# Patient Record
Sex: Female | Born: 1965 | Hispanic: Yes | Smoking: Never smoker
Health system: Southern US, Community
[De-identification: ages and names within clinical notes are randomized; demographics above are authoritative.]

## PROBLEM LIST (undated history)

## (undated) DIAGNOSIS — E785 Hyperlipidemia, unspecified: Secondary | ICD-10-CM

## (undated) HISTORY — DX: Hyperlipidemia, unspecified: E78.5

## (undated) HISTORY — PX: TUBAL LIGATION: SHX77

---

## 2010-11-07 ENCOUNTER — Other Ambulatory Visit (HOSPITAL_COMMUNITY): Payer: Self-pay | Admitting: Family Medicine

## 2010-11-07 DIAGNOSIS — Z139 Encounter for screening, unspecified: Secondary | ICD-10-CM

## 2010-11-12 ENCOUNTER — Encounter (HOSPITAL_COMMUNITY): Payer: Self-pay

## 2010-11-13 ENCOUNTER — Inpatient Hospital Stay (HOSPITAL_COMMUNITY): Admission: RE | Admit: 2010-11-13 | Payer: Self-pay | Source: Ambulatory Visit

## 2013-12-23 ENCOUNTER — Other Ambulatory Visit (HOSPITAL_COMMUNITY): Payer: Self-pay | Admitting: Physician Assistant

## 2013-12-23 DIAGNOSIS — Z1231 Encounter for screening mammogram for malignant neoplasm of breast: Secondary | ICD-10-CM

## 2014-01-02 ENCOUNTER — Encounter (HOSPITAL_COMMUNITY): Payer: Self-pay

## 2014-01-03 ENCOUNTER — Ambulatory Visit (HOSPITAL_COMMUNITY): Payer: Self-pay

## 2014-01-09 ENCOUNTER — Ambulatory Visit (HOSPITAL_COMMUNITY): Payer: Self-pay

## 2014-01-16 ENCOUNTER — Ambulatory Visit (HOSPITAL_COMMUNITY)
Admission: RE | Admit: 2014-01-16 | Discharge: 2014-01-16 | Disposition: A | Discharge status: Home / Self Care | Payer: Self-pay | Source: Ambulatory Visit | Attending: Physician Assistant | Admitting: Physician Assistant

## 2014-01-16 DIAGNOSIS — Z1231 Encounter for screening mammogram for malignant neoplasm of breast: Secondary | ICD-10-CM

## 2014-01-20 ENCOUNTER — Other Ambulatory Visit: Payer: Self-pay | Admitting: Physician Assistant

## 2014-01-20 DIAGNOSIS — R928 Other abnormal and inconclusive findings on diagnostic imaging of breast: Secondary | ICD-10-CM

## 2014-02-21 ENCOUNTER — Encounter (HOSPITAL_COMMUNITY): Payer: Self-pay

## 2014-03-07 ENCOUNTER — Ambulatory Visit (HOSPITAL_COMMUNITY)
Admission: RE | Admit: 2014-03-07 | Discharge: 2014-03-07 | Disposition: A | Discharge status: Home / Self Care | Payer: PRIVATE HEALTH INSURANCE | Source: Ambulatory Visit | Attending: Physician Assistant | Admitting: Physician Assistant

## 2014-03-07 DIAGNOSIS — R928 Other abnormal and inconclusive findings on diagnostic imaging of breast: Secondary | ICD-10-CM | POA: Insufficient documentation

## 2014-09-11 ENCOUNTER — Other Ambulatory Visit (HOSPITAL_COMMUNITY): Payer: Self-pay | Admitting: *Deleted

## 2014-09-11 DIAGNOSIS — Z09 Encounter for follow-up examination after completed treatment for conditions other than malignant neoplasm: Secondary | ICD-10-CM

## 2014-09-13 ENCOUNTER — Encounter (HOSPITAL_COMMUNITY): Payer: Self-pay

## 2014-10-24 ENCOUNTER — Ambulatory Visit (HOSPITAL_COMMUNITY)
Admission: RE | Admit: 2014-10-24 | Discharge: 2014-10-24 | Disposition: A | Discharge status: Home / Self Care | Payer: PRIVATE HEALTH INSURANCE | Source: Ambulatory Visit | Attending: *Deleted | Admitting: *Deleted

## 2014-10-24 DIAGNOSIS — Z09 Encounter for follow-up examination after completed treatment for conditions other than malignant neoplasm: Secondary | ICD-10-CM | POA: Insufficient documentation

## 2014-10-24 DIAGNOSIS — R921 Mammographic calcification found on diagnostic imaging of breast: Secondary | ICD-10-CM | POA: Insufficient documentation

## 2014-10-25 ENCOUNTER — Emergency Department (HOSPITAL_COMMUNITY)
Admission: EM | Admit: 2014-10-25 | Discharge: 2014-10-25 | Disposition: A | Discharge status: Home / Self Care | Payer: Self-pay | Attending: Emergency Medicine | Admitting: Emergency Medicine

## 2014-10-25 ENCOUNTER — Encounter (HOSPITAL_COMMUNITY): Payer: Self-pay | Admitting: Emergency Medicine

## 2014-10-25 ENCOUNTER — Emergency Department (HOSPITAL_COMMUNITY): Payer: Self-pay

## 2014-10-25 DIAGNOSIS — M5441 Lumbago with sciatica, right side: Secondary | ICD-10-CM | POA: Insufficient documentation

## 2014-10-25 DIAGNOSIS — Z3202 Encounter for pregnancy test, result negative: Secondary | ICD-10-CM | POA: Insufficient documentation

## 2014-10-25 DIAGNOSIS — M5136 Other intervertebral disc degeneration, lumbar region: Secondary | ICD-10-CM | POA: Insufficient documentation

## 2014-10-25 LAB — URINALYSIS, ROUTINE W REFLEX MICROSCOPIC
BILIRUBIN URINE: NEGATIVE
Glucose, UA: NEGATIVE mg/dL
HGB URINE DIPSTICK: NEGATIVE
Ketones, ur: NEGATIVE mg/dL
Leukocytes, UA: NEGATIVE
NITRITE: NEGATIVE
PH: 6 (ref 5.0–8.0)
Protein, ur: NEGATIVE mg/dL
Specific Gravity, Urine: <1.005 — ABNORMAL LOW (ref 1.005–1.030)
Urobilinogen, UA: 0.2 mg/dL (ref 0.0–1.0)

## 2014-10-25 LAB — POC URINE PREG, ED: Preg Test, Ur: NEGATIVE

## 2014-10-25 MED ORDER — NAPROXEN 500 MG PO TABS
500.0000 mg | ORAL_TABLET | Freq: Two times a day (BID) | ORAL | Status: DC
Start: 2014-10-25 — End: 2014-11-16

## 2014-10-25 MED ORDER — CYCLOBENZAPRINE HCL 10 MG PO TABS
10.0000 mg | ORAL_TABLET | Freq: Once | ORAL | Status: AC
  Administered 2014-10-25: 10 mg via ORAL
  Filled 2014-10-25: qty 1

## 2014-10-25 MED ORDER — HYDROCODONE-ACETAMINOPHEN 5-325 MG PO TABS: 1.0000 | ORAL_TABLET | ORAL | Status: DC | PRN

## 2014-10-25 MED ORDER — CYCLOBENZAPRINE HCL 10 MG PO TABS: 10.0000 mg | ORAL_TABLET | Freq: Two times a day (BID) | ORAL | Status: DC | PRN

## 2014-10-25 MED ORDER — KETOROLAC TROMETHAMINE 60 MG/2ML IM SOLN
30.0000 mg | Freq: Once | INTRAMUSCULAR | Status: AC
  Administered 2014-10-25: 30 mg via INTRAMUSCULAR
  Filled 2014-10-25: qty 2

## 2014-10-25 NOTE — ED Notes (Signed)
Patient reports she has not had a menstrual cycle in 8 years, but states she has not had a hysterectomy.  She has had a tubal ligation.

## 2014-10-25 NOTE — Discharge Instructions (Signed)
Your CT scan tonight shows that you have a disc that is causing the problem in your lower back. You will need to follow up with Dr. Romeo AppleHarrison for further evaluation and treatment of the problem. Do not take the narcotic or the muscle relaxant if driving because they will make you sleepy.

## 2014-10-25 NOTE — ED Notes (Signed)
Pt states that she is having pain in right hip and lower back that started yesterday with no injury.

## 2014-10-25 NOTE — ED Provider Notes (Signed)
CSN: [id number]     Arrival date & time 10/25/14  1714 History   First MD Initiated Contact with Patient 10/25/14 1843     Chief Complaint  Patient presents with  . Back Pain     (Consider location/radiation/quality/duration/timing/severity/associated sxs/prior Treatment) Patient is a 49 y.o. female presenting with back pain. The history is provided by the patient.  Back Pain Location:  Lumbar spine Quality:  Aching Radiates to: right leg. Pain severity:  Severe Pain is:  Same all the time Onset quality:  Gradual Duration:  1 day Timing:  Constant Progression:  Worsening Chronicity:  New Relieved by:  Nothing Worsened by:  Ambulation, movement and standing Ineffective treatments:  None tried Associated symptoms: no bladder incontinence and no bowel incontinence    Heidi Gibson is a 49 y.o. female who presents to the ED with low back pain that radiates to the right hip. The pain started yesterday. She had a similar pain about a year ago and was told it was muscle pain. Occasionally the pain with radiate around to the abdomen.    History reviewed. No pertinent past medical history. Past Surgical History  Procedure Laterality Date  . Cesarean section     History reviewed. No pertinent family history. History  Substance Use Topics  . Smoking status: Never Smoker   . Smokeless tobacco: Not on file  . Alcohol Use: No   OB History    No data available     Review of Systems  Gastrointestinal: Negative for bowel incontinence.  Genitourinary: Negative for bladder incontinence.  Musculoskeletal: Positive for back pain.       Right leg pain  all other systems negataive    Allergies  Review of patient's allergies indicates no known allergies.  Home Medications   Prior to Admission medications   Medication Sig Start Date End Date Taking? Authorizing Provider  cyclobenzaprine (FLEXERIL) 10 MG tablet Take 1 tablet (10 mg total) by mouth 2 (two) times daily as needed  for muscle spasms. 10/25/14   Hope Orlene OchM Neese, NP  HYDROcodone-acetaminophen (NORCO/VICODIN) 5-325 MG per tablet Take 1 tablet by mouth every 4 (four) hours as needed. 10/25/14   Hope Orlene OchM Neese, NP  naproxen (NAPROSYN) 500 MG tablet Take 1 tablet (500 mg total) by mouth 2 (two) times daily. 10/25/14   Hope Orlene OchM Neese, NP   BP 112/60 mmHg  Pulse 88  Temp(Src) 98.1 F (36.7 C) (Oral)  Resp 20  Ht 5\' 6"  (1.676 m)  Wt 237 lb (107.502 kg)  BMI 38.27 kg/m2  SpO2 100%  LMP  Physical Exam  Constitutional: She is oriented to person, place, and time. She appears well-developed and well-nourished. No distress.  HENT:  Head: Normocephalic and atraumatic.  Right Ear: Tympanic membrane normal.  Left Ear: Tympanic membrane normal.  Nose: Nose normal.  Mouth/Throat: Uvula is midline, oropharynx is clear and moist and mucous membranes are normal.  Eyes: Conjunctivae and EOM are normal.  Neck: Normal range of motion. Neck supple.  Cardiovascular: Normal rate and regular rhythm.   Pulmonary/Chest: Effort normal. She has no wheezes. She has no rales.  Abdominal: Soft. Bowel sounds are normal. There is no tenderness.  Musculoskeletal: Normal range of motion.       Lumbar back: She exhibits tenderness, pain and spasm. She exhibits normal pulse.       Back:       Legs: Pedal pulses 2+ bilateral. Straight leg raises without difficulty. Pain with raising right. Adequate circulation. Pain radiates  from lumbar area to right buttock, thigh and calf. Ambulatory without foot drag.   Neurological: She is alert and oriented to person, place, and time. She has normal strength. No cranial nerve deficit or sensory deficit. Gait normal.  Reflex Scores:      Bicep reflexes are 2+ on the right side and 2+ on the left side.      Brachioradialis reflexes are 2+ on the right side and 2+ on the left side.      Patellar reflexes are 2+ on the right side and 2+ on the left side.      Achilles reflexes are 2+ on the right side and 2+  on the left side. Skin: Skin is warm and dry.  Psychiatric: She has a normal mood and affect. Her behavior is normal.  Nursing note and vitals reviewed.   ED Course  Procedures (including critical care time) Flexeril, Toradol, CT lumbar spine  Results for orders placed or performed during the hospital encounter of 10/25/14 (from the past 24 hour(s))  Urinalysis, Routine w reflex microscopic     Status: Abnormal   Collection Time: 10/25/14  8:03 PM  Result Value Ref Range   Color, Urine YELLOW YELLOW   APPearance CLEAR CLEAR   Specific Gravity, Urine <1.005 (L) 1.005 - 1.030   pH 6.0 5.0 - 8.0   Glucose, UA NEGATIVE NEGATIVE mg/dL   Hgb urine dipstick NEGATIVE NEGATIVE   Bilirubin Urine NEGATIVE NEGATIVE   Ketones, ur NEGATIVE NEGATIVE mg/dL   Protein, ur NEGATIVE NEGATIVE mg/dL   Urobilinogen, UA 0.2 0.0 - 1.0 mg/dL   Nitrite NEGATIVE NEGATIVE   Leukocytes, UA NEGATIVE NEGATIVE  POC Urine Pregnancy, ED (do NOT [facility] at [facility])     Status: None   Collection Time: 10/25/14  8:16 PM  Result Value Ref Range   Preg Test, Ur NEGATIVE NEGATIVE     Ct Lumbar Spine Wo Contrast  10/25/2014   CLINICAL DATA:  Right hip and lower back pain since yesterday. No known trauma.  EXAM: CT LUMBAR SPINE WITHOUT CONTRAST  TECHNIQUE: Multidetector CT imaging of the lumbar spine was performed without intravenous contrast administration. Multiplanar CT image reconstructions were also generated.  COMPARISON:  None.  FINDINGS: Vertebral column:  The lumbar vertebrae are normal in height. There is no spondylolisthesis. There is no bone lesion or bony destruction.  Posterior elements: The pedicles and posterior elements are intact. There is mild bilateral facet arthritis at L3-4, greater on the right. There is minimal facet arthritis at L4-5 on the left and mild facet arthritis at L5-S1 on the left.  Interspaces:  T12-L1:  Negative for significant abnormality.  L1-2:  Negative for significant abnormality.  L2-3:   Negative for significant abnormality.  L3-4:  Mild bulge without central canal or foraminal stenosis.  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  L5-S1: Mild bulge with patent central canal and mildly narrowed bilateral foramina.  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 Mild non stenotic degenerative disc and facet changes elsewhere. Lumbar MRI without contrast would be optimal for characterization.   Electronically Signed   By: Ellery Plunkaniel R Mitchell M.D.   On: 10/25/2014 23:18     MDM  49 y.o. female with low back pain that radiates to the right leg. Stable for d/c without focal neuro deficits and without neurovascular compromise. She will follow up with ortho. Will treat  for pain and muscle spasm. I have reviewed this patient's vital signs, nurses notes, appropriate labs and imaging. i have discussed findings and plan of care with the patient and her family. They voice understanding and agree with plan.   Final diagnoses:  Lumbar disc narrowing  Midline low back pain with right-sided sciatica      Janne Napoleon, NP 10/26/14 0001  Vanetta Mulders, MD 10/26/14 [phone]

## 2014-10-30 ENCOUNTER — Telehealth: Payer: Self-pay | Admitting: Orthopedic Surgery

## 2014-10-30 NOTE — Telephone Encounter (Signed)
No Self Pay

## 2014-10-30 NOTE — Telephone Encounter (Signed)
Patient was treated in the ER on Wednesday 10/25/14 for back pain, They have referred her to Follow up here, Dr. Romeo AppleHarrison could you please review her chart and see if this is something you can treat here at the office, please advise?

## 2014-10-30 NOTE — Telephone Encounter (Signed)
Patient has been scheduled for 11/14/14 and [facility] patients interpreter is aware

## 2014-10-30 NOTE — Telephone Encounter (Signed)
Ok   i ll see her in 2 weeks

## 2014-10-30 NOTE — Telephone Encounter (Signed)
Is she insured?  Need to know in case need to refer

## 2014-11-14 ENCOUNTER — Ambulatory Visit: Payer: Self-pay | Admitting: Orthopedic Surgery

## 2014-11-16 ENCOUNTER — Encounter: Payer: Self-pay | Admitting: Orthopedic Surgery

## 2014-11-16 ENCOUNTER — Telehealth: Payer: Self-pay | Admitting: *Deleted

## 2014-11-16 ENCOUNTER — Other Ambulatory Visit: Payer: Self-pay | Admitting: *Deleted

## 2014-11-16 ENCOUNTER — Ambulatory Visit (INDEPENDENT_AMBULATORY_CARE_PROVIDER_SITE_OTHER): Payer: Self-pay | Admitting: Orthopedic Surgery

## 2014-11-16 VITALS — BP 112/79 | Ht 66.0 in | Wt 237.0 lb

## 2014-11-16 DIAGNOSIS — M48061 Spinal stenosis, lumbar region without neurogenic claudication: Secondary | ICD-10-CM

## 2014-11-16 DIAGNOSIS — M5126 Other intervertebral disc displacement, lumbar region: Secondary | ICD-10-CM

## 2014-11-16 DIAGNOSIS — M4806 Spinal stenosis, lumbar region: Secondary | ICD-10-CM

## 2014-11-16 MED ORDER — PREDNISONE 10 MG (48) PO TBPK: ORAL_TABLET | Freq: Every day | ORAL | Status: DC

## 2014-11-16 MED ORDER — GABAPENTIN 100 MG PO CAPS: 100.0000 mg | ORAL_CAPSULE | Freq: Three times a day (TID) | ORAL | Status: DC

## 2014-11-16 MED ORDER — CYCLOBENZAPRINE HCL 10 MG PO TABS: 10.0000 mg | ORAL_TABLET | Freq: Two times a day (BID) | ORAL | Status: DC | PRN

## 2014-11-16 NOTE — Patient Instructions (Signed)
Will refer to back specialist

## 2014-11-16 NOTE — Progress Notes (Signed)
Patient ID: Heidi Gibson, female   DOB: Jan 31, 1966, 49 y.o.   MRN: [id number] Patient ID: Heidi LollingMaria Delcarmen Nienhaus, female   DOB: Jan 31, 1966, 49 y.o.   MRN: [id number]   Chief Complaint  Patient presents with  . Back Pain    er follow up right hip/lower back pain     Heidi LollingMaria Delcarmen Njoku is a 49 y.o. female.   HPI [age] year old female with a several year history of intermittent back pain presented to the ER with right leg pain associated with lower back pain with no history of recent trauma. Her symptoms started years ago when she fell coming out of her trailer. She denies any bowel or bladder dysfunction but notes pain numbness tingling without weakness in the right lower extremity associated with lower back pain. She was on naproxen Norco and Flexeril after ER visit with minimal improvement   Review of Systems See hpi  No past medical history on file.  Past Surgical History  Procedure Laterality Date  . Cesarean section      No family history on file.  Social History History  Substance Use Topics  . Smoking status: Never Smoker   . Smokeless tobacco: Not on file  . Alcohol Use: No    No Known Allergies  Current Outpatient Prescriptions  Medication Sig Dispense Refill  . cyclobenzaprine (FLEXERIL) 10 MG tablet Take 1 tablet (10 mg total) by mouth 2 (two) times daily as needed for muscle spasms. 30 tablet 0  . gabapentin (NEURONTIN) 100 MG capsule Take 1 capsule (100 mg total) by mouth 3 (three) times daily. 90 capsule 0  . predniSONE (STERAPRED UNI-PAK 48 TAB) 10 MG (48) TBPK tablet Take by mouth daily. Use as directed 48 tablet 0   No current facility-administered medications for this visit.       Physical Exam Blood pressure 112/79, height 5\' 6"  (1.676 m), weight 237 lb (107.502 kg). Physical Exam The patient is well developed well nourished and well groomed. Orientation to person place and time is normal  Mood is pleasant. Ambulatory  status she is walking she does not appear to have a limp  She has tenderness in her lower back starting at L1-L5. 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  . Herniated lumbar intervertebral disc Yes  . Spinal stenosis of lumbar region     Plan I can only manage this medically and recommended she see a spine surgeon for further recommendations and treatment.  I did change her medication to   Sterapred Dosepak  Gabapentin  Continue Flexeril

## 2014-11-16 NOTE — Telephone Encounter (Signed)
REFERRAL FAXED TO [facility] 

## 2014-11-30 NOTE — Telephone Encounter (Signed)
appt with DR Gerlene Fee 12/06/14 1:30

## 2015-03-15 ENCOUNTER — Other Ambulatory Visit (HOSPITAL_COMMUNITY): Payer: Self-pay | Admitting: *Deleted

## 2015-03-15 DIAGNOSIS — Z1231 Encounter for screening mammogram for malignant neoplasm of breast: Secondary | ICD-10-CM

## 2015-03-19 ENCOUNTER — Ambulatory Visit (HOSPITAL_COMMUNITY)
Admission: RE | Admit: 2015-03-19 | Discharge: 2015-03-19 | Disposition: A | Discharge status: Home / Self Care | Payer: PRIVATE HEALTH INSURANCE | Source: Ambulatory Visit | Attending: *Deleted | Admitting: *Deleted

## 2015-03-19 DIAGNOSIS — Z1231 Encounter for screening mammogram for malignant neoplasm of breast: Secondary | ICD-10-CM | POA: Insufficient documentation

## 2015-08-22 ENCOUNTER — Ambulatory Visit: Payer: Self-pay | Admitting: Physician Assistant

## 2015-08-22 ENCOUNTER — Encounter: Payer: Self-pay | Admitting: Physician Assistant

## 2015-08-22 VITALS — BP 100/72 | HR 98 | Temp 97.9°F | Ht 63.25 in | Wt 242.0 lb

## 2015-08-22 DIAGNOSIS — E785 Hyperlipidemia, unspecified: Secondary | ICD-10-CM

## 2015-08-22 NOTE — Progress Notes (Signed)
   BP 100/72 mmHg  Pulse 98  Temp(Src) 97.9 F (36.6 C)  Ht 5' 3.25" (1.607 m)  Wt 242 lb (109.77 kg)  BMI 42.51 kg/m2  SpO2 96%   Subjective:    Patient ID: Heidi Gibson, female    DOB: [birth year], 50 y.o.   MRN: [id number]  HPI: Heidi Gibson is a 50 y.o. female presenting on 08/22/2015 for Follow-up and Fever   HPI   Chief Complaint  Patient presents with  . Hyperlipidemia    pt did not get labwork done due to her being sick.  . Fever    102 F fever yesterday. has been sick since last week, with chills and diarrhea. now feels tired, dizzy and nauseous    Pt states feeling much better now than last week.  Relevant past medical, surgical, family and social history reviewed and updated as indicated. Interim medical history since our last visit reviewed. Allergies and medications reviewed and updated.   Current outpatient prescriptions:  .  bismuth subsalicylate (PEPTO BISMOL) 262 MG/15ML suspension, Take 30 mLs by mouth every 6 (six) hours as needed., Disp: , Rfl:  .  ibuprofen (ADVIL,MOTRIN) 200 MG tablet, Take 200 mg by mouth every 6 (six) hours as needed., Disp: , Rfl:  .  simvastatin (ZOCOR) 20 MG tablet, Take 20 mg by mouth daily., Disp: , Rfl:   Review of Systems  Per HPI unless specifically indicated above     Objective:    BP 100/72 mmHg  Pulse 98  Temp(Src) 97.9 F (36.6 C)  Ht 5' 3.25" (1.607 m)  Wt 242 lb (109.77 kg)  BMI 42.51 kg/m2  SpO2 96%  Wt Readings from Last 3 Encounters:  08/22/15 242 lb (109.77 kg)  11/16/14 237 lb (107.502 kg)  10/25/14 237 lb (107.502 kg)    Physical Exam  Constitutional: She is oriented to person, place, and time. She appears well-developed and well-nourished.  HENT:  Head: Normocephalic and atraumatic.  Right Ear: Hearing, tympanic membrane, external ear and ear canal normal.  Left Ear: Hearing, tympanic membrane, external ear and ear canal normal.  Nose: Nose normal.  Mouth/Throat:  Uvula is midline and oropharynx is clear and moist. No oropharyngeal exudate.  Neck: Neck supple.  Cardiovascular: Normal rate and regular rhythm.   Pulmonary/Chest: Effort normal and breath sounds normal. She has no wheezes.  Abdominal: Soft. Bowel sounds are normal. She exhibits no mass. There is no hepatosplenomegaly. There is no tenderness.  Musculoskeletal: She exhibits no edema.  Lymphadenopathy:    She has no cervical adenopathy.  Neurological: She is alert and oriented to person, place, and time.  Skin: Skin is warm and dry.  Psychiatric: She has a normal mood and affect. Her behavior is normal.  Vitals reviewed.       Assessment & Plan:   Encounter Diagnoses  Name Primary?  . Hyperlipidemia Yes  . Morbid obesity, unspecified obesity type ([facility])     -discussed likely flu that is improving -pt to get labs drawn -f/u 6 months.  RTO sooner prn

## 2015-08-23 ENCOUNTER — Other Ambulatory Visit: Payer: Self-pay | Admitting: Physician Assistant

## 2015-08-23 LAB — COMPREHENSIVE METABOLIC PANEL
ALK PHOS: 105 U/L (ref 33–115)
ALT: 14 U/L (ref 6–29)
AST: 17 U/L (ref 10–35)
Albumin: 4.2 g/dL (ref 3.6–5.1)
BILIRUBIN TOTAL: 0.4 mg/dL (ref 0.2–1.2)
BUN: 12 mg/dL (ref 7–25)
CALCIUM: 9.4 mg/dL (ref 8.6–10.2)
CO2: 29 mmol/L (ref 20–31)
Chloride: 100 mmol/L (ref 98–110)
Creat: 0.73 mg/dL (ref 0.50–1.10)
Glucose, Bld: 92 mg/dL (ref 65–99)
Potassium: 4.4 mmol/L (ref 3.5–5.3)
SODIUM: 140 mmol/L (ref 135–146)
Total Protein: 7.4 g/dL (ref 6.1–8.1)

## 2015-08-23 LAB — LIPID PANEL
CHOLESTEROL: 208 mg/dL — AB (ref 125–200)
HDL: 42 mg/dL — AB (ref >=46)
LDL Cholesterol: 121 mg/dL (ref <130)
Total CHOL/HDL Ratio: 5 Ratio (ref <=5.0)
Triglycerides: 223 mg/dL — ABNORMAL HIGH (ref <150)
VLDL: 45 mg/dL — ABNORMAL HIGH (ref <30)

## 2015-08-27 DIAGNOSIS — E785 Hyperlipidemia, unspecified: Secondary | ICD-10-CM | POA: Insufficient documentation

## 2015-08-27 DIAGNOSIS — E66812 Obesity, class 2: Secondary | ICD-10-CM | POA: Insufficient documentation

## 2015-09-26 ENCOUNTER — Other Ambulatory Visit: Payer: Self-pay | Admitting: Physician Assistant

## 2015-09-26 MED ORDER — SIMVASTATIN 20 MG PO TABS: 20.0000 mg | ORAL_TABLET | Freq: Every day | ORAL | Status: DC

## 2016-02-19 ENCOUNTER — Other Ambulatory Visit: Payer: Self-pay | Admitting: Student

## 2016-02-19 DIAGNOSIS — E785 Hyperlipidemia, unspecified: Secondary | ICD-10-CM

## 2016-02-21 LAB — COMPLETE METABOLIC PANEL WITH GFR
ALT: 15 U/L (ref 6–29)
AST: 19 U/L (ref 10–35)
Albumin: 4.3 g/dL (ref 3.6–5.1)
Alkaline Phosphatase: 124 U/L — ABNORMAL HIGH (ref 33–115)
BUN: 9 mg/dL (ref 7–25)
CHLORIDE: 104 mmol/L (ref 98–110)
CO2: 29 mmol/L (ref 20–31)
CREATININE: 0.62 mg/dL (ref 0.50–1.10)
Calcium: 9.1 mg/dL (ref 8.6–10.2)
GFR, Est African American: >89 mL/min (ref >=60)
GFR, Est Non African American: >89 mL/min (ref >=60)
Glucose, Bld: 96 mg/dL (ref 65–99)
POTASSIUM: 4.2 mmol/L (ref 3.5–5.3)
Sodium: 140 mmol/L (ref 135–146)
Total Bilirubin: 0.5 mg/dL (ref 0.2–1.2)
Total Protein: 7.3 g/dL (ref 6.1–8.1)

## 2016-02-21 LAB — LIPID PANEL
CHOL/HDL RATIO: 3.5 ratio (ref <=5.0)
Cholesterol: 170 mg/dL (ref 125–200)
HDL: 48 mg/dL (ref >=46)
LDL CALC: 89 mg/dL (ref <130)
Triglycerides: 167 mg/dL — ABNORMAL HIGH (ref <150)
VLDL: 33 mg/dL — AB (ref <30)

## 2016-02-25 ENCOUNTER — Ambulatory Visit: Payer: Self-pay | Admitting: Physician Assistant

## 2016-02-25 ENCOUNTER — Encounter: Payer: Self-pay | Admitting: Physician Assistant

## 2016-02-25 VITALS — BP 114/78 | HR 98 | Temp 97.9°F | Ht 63.0 in | Wt 242.0 lb

## 2016-02-25 DIAGNOSIS — E785 Hyperlipidemia, unspecified: Secondary | ICD-10-CM

## 2016-02-25 DIAGNOSIS — Z1239 Encounter for other screening for malignant neoplasm of breast: Secondary | ICD-10-CM

## 2016-02-25 NOTE — Progress Notes (Signed)
BP 114/78 (BP Location: Left Arm, Patient Position: Sitting, Cuff Size: Large)   Pulse 98   Temp 97.9 F (36.6 C) (Other (Comment))   Ht '5\' 3"'$  (1.6 m)   Wt 242 lb (109.8 kg)   SpO2 96%   BMI 42.87 kg/m    Subjective:    Patient ID: Heidi Gibson, female    DOB: [birth year], 50 y.o.   MRN: [id number]  HPI: Heidi Gibson is a 50 y.o. female presenting on 02/25/2016 for Hyperlipidemia   HPI  Pt is doing well.  No commpalints today.   Relevant past medical, surgical, family and social history reviewed and updated as indicated. Interim medical history since our last visit reviewed. Allergies and medications reviewed and updated.   Current Outpatient Prescriptions:  .  simvastatin (ZOCOR) 20 MG tablet, Take 1 tablet (20 mg total) by mouth daily. Tome una tableta por boca al dormir, Disp: 30 tablet, Rfl: 5   Review of Systems  Constitutional: Negative for appetite change, chills, diaphoresis, fatigue, fever and unexpected weight change.  HENT: Positive for sore throat. Negative for congestion, drooling, ear pain, facial swelling, hearing loss, mouth sores, sneezing, trouble swallowing and voice change.   Eyes: Negative for pain, discharge, redness, itching and visual disturbance.  Respiratory: Negative for cough, choking, shortness of breath and wheezing.   Cardiovascular: Positive for leg swelling. Negative for chest pain and palpitations.  Gastrointestinal: Negative for abdominal pain, blood in stool, constipation, diarrhea and vomiting.  Endocrine: Negative for cold intolerance, heat intolerance and polydipsia.  Genitourinary: Negative for decreased urine volume, dysuria and hematuria.  Musculoskeletal: Negative for arthralgias, back pain and gait problem.  Skin: Negative for rash.  Allergic/Immunologic: Negative for environmental allergies.  Neurological: Negative for seizures, syncope, light-headedness and headaches.  Hematological: Negative for  adenopathy.  Psychiatric/Behavioral: Negative for agitation, dysphoric mood and suicidal ideas. The patient is not nervous/anxious.     Per HPI unless specifically indicated above     Objective:    BP 114/78 (BP Location: Left Arm, Patient Position: Sitting, Cuff Size: Large)   Pulse 98   Temp 97.9 F (36.6 C) (Other (Comment))   Ht '5\' 3"'$  (1.6 m)   Wt 242 lb (109.8 kg)   SpO2 96%   BMI 42.87 kg/m   Wt Readings from Last 3 Encounters:  02/25/16 242 lb (109.8 kg)  08/22/15 242 lb (109.8 kg)  11/16/14 237 lb (107.5 kg)    Physical Exam  Constitutional: She is oriented to person, place, and time. She appears well-developed and well-nourished.  HENT:  Head: Normocephalic and atraumatic.  Neck: Neck supple.  Cardiovascular: Normal rate and regular rhythm.   Pulmonary/Chest: Effort normal and breath sounds normal.  Abdominal: Soft. Bowel sounds are normal. She exhibits no mass. There is no hepatosplenomegaly. There is no tenderness.  Musculoskeletal: She exhibits no edema.  Lymphadenopathy:    She has no cervical adenopathy.  Neurological: She is alert and oriented to person, place, and time.  Skin: Skin is warm and dry.  Psychiatric: She has a normal mood and affect. Her behavior is normal.  Vitals reviewed.   Results for orders placed or performed in visit on 02/19/16  Lipid Profile  Result Value Ref Range   Cholesterol 170 125 - 200 mg/dL   Triglycerides 167 (H) <150 mg/dL   HDL 48 >=46 mg/dL   Total CHOL/HDL Ratio 3.5 <=5.0 Ratio   VLDL 33 (H) <30 mg/dL   LDL Cholesterol 89 <130 mg/dL  COMPLETE  METABOLIC PANEL WITH GFR  Result Value Ref Range   Sodium 140 135 - 146 mmol/L   Potassium 4.2 3.5 - 5.3 mmol/L   Chloride 104 98 - 110 mmol/L   CO2 29 20 - 31 mmol/L   Glucose, Bld 96 65 - 99 mg/dL   BUN 9 7 - 25 mg/dL   Creat 0.62 0.50 - 1.10 mg/dL   Total Bilirubin 0.5 0.2 - 1.2 mg/dL   Alkaline Phosphatase 124 (H) 33 - 115 U/L   AST 19 10 - 35 U/L   ALT 15 6 - 29  U/L   Total Protein 7.3 6.1 - 8.1 g/dL   Albumin 4.3 3.6 - 5.1 g/dL   Calcium 9.1 8.6 - 10.2 mg/dL   GFR, Est African American >89 >=60 mL/min   GFR, Est Non African American >89 >=60 mL/min      Assessment & Plan:   Encounter Diagnoses  Name Primary?  . Hyperlipidemia Yes  . Morbid obesity, unspecified obesity type ([place])   . Screening for breast cancer     -reviewed labs with pt.  Alk phos mildly elevated- will monitor -Continue simvastatin. Watch lowfat diet -mammogram in october -F/u 6 months for cholesterol and PAP screening

## 2016-03-21 ENCOUNTER — Ambulatory Visit (HOSPITAL_COMMUNITY)
Admission: RE | Admit: 2016-03-21 | Discharge: 2016-03-21 | Disposition: A | Discharge status: Home / Self Care | Payer: PRIVATE HEALTH INSURANCE | Source: Ambulatory Visit | Attending: Physician Assistant | Admitting: Physician Assistant

## 2016-07-28 ENCOUNTER — Ambulatory Visit: Payer: Self-pay | Admitting: Physician Assistant

## 2016-07-28 ENCOUNTER — Encounter: Payer: Self-pay | Admitting: Physician Assistant

## 2016-07-28 VITALS — BP 100/72 | HR 98 | Temp 97.9°F | Ht 63.0 in | Wt 242.8 lb

## 2016-07-28 DIAGNOSIS — J069 Acute upper respiratory infection, unspecified: Secondary | ICD-10-CM

## 2016-07-28 MED ORDER — BENZONATATE 100 MG PO CAPS
ORAL_CAPSULE | ORAL | 3 refills | Status: DC
Start: 2016-07-28 — End: 2016-08-25

## 2016-07-28 MED ORDER — MAGIC MOUTHWASH W/LIDOCAINE: ORAL | 0 refills | Status: DC

## 2016-07-28 NOTE — Patient Instructions (Signed)
Infeccin del tracto respiratorio superior, adultos (Upper Respiratory Infection, Adult) La mayora de las infecciones del tracto respiratorio superior son infecciones virales de las vas que llevan el aire a los pulmones. Un infeccin del tracto respiratorio superior afecta la nariz, la garganta y las vas respiratorias superiores. El tipo ms frecuente de infeccin del tracto respiratorio superior es la nasofaringitis, que habitualmente se conoce como "resfro comn". Las infecciones del tracto respiratorio superior siguen su curso y por lo general se curan solas. En la mayora de los casos, la infeccin del tracto respiratorio superior no requiere atencin mdica, pero a veces, despus de una infeccin viral, puede surgir una infeccin bacteriana en las vas respiratorias superiores. Esto se conoce como infeccin secundaria. Las infecciones sinusales y en el odo medio son tipos frecuentes de infecciones secundarias en el tracto respiratorio superior. La neumona bacteriana tambin puede complicar un cuadro de infeccin del tracto respiratorio superior. Este tipo de infeccin puede empeorar el asma y la enfermedad pulmonar obstructiva crnica (EPOC). En algunos casos, estas complicaciones pueden requerir atencin mdica de emergencia y poner en peligro la vida. CAUSAS Casi todas las infecciones del tracto respiratorio superior se deben a los virus. Un virus es un tipo de microbio que puede contagiarse de una persona a otra. FACTORES DE RIESGO Puede estar en riesgo de sufrir una infeccin del tracto respiratorio superior si:  Fuma.  Tiene una enfermedad pulmonar o cardaca crnica.  Tiene debilitado el sistema de defensa (inmunitario) del cuerpo.  Es muy joven o de edad muy avanzada.  Tiene asma o alergias nasales.  Trabaja en reas donde hay mucha gente o poca ventilacin.  Trabaja en una escuela o en un centro de atencin mdica. SIGNOS Y SNTOMAS Habitualmente, los sntomas aparecen de  2a 3das despus de entrar en contacto con el virus del resfro. La mayora de las infecciones virales en el tracto respiratorio superior duran de 7a 10das. Sin embargo, las infecciones virales en el tracto respiratorio superior a causa del virus de la gripe pueden durar de 14a 18das y, habitualmente, son ms graves. Entre los sntomas se pueden incluir los siguientes:  Secrecin o congestin nasal.  Estornudos.  Tos.  Dolor de garganta.  Dolor de cabeza.  Fatiga.  Fiebre.  Prdida del apetito.  Dolor en la frente, detrs de los ojos y por encima de los pmulos (dolor sinusal).  Dolores musculares. DIAGNSTICO El mdico puede diagnosticar una infeccin del tracto respiratorio superior mediante los siguientes estudios:  Examen fsico.  Pruebas para verificar si los sntomas no se deben a otra afeccin, por ejemplo:  Faringitis estreptoccica.  Sinusitis.  Neumona.  Asma. TRATAMIENTO Esta infeccin desaparece sola, con el tiempo. No puede curarse con medicamentos, pero a menudo se prescriben para aliviar los sntomas. Los medicamentos pueden ser tiles para lo siguiente:  Bajar la fiebre.  Reducir la tos.  Aliviar la congestin nasal. INSTRUCCIONES PARA EL CUIDADO EN EL HOGAR  Tome los medicamentos solamente como se lo haya indicado el mdico.  A fin de aliviar el dolor de garganta, haga grgaras con solucin salina templada o consuma caramelos para la tos, como se lo haya indicado el mdico.  Use un humidificador de vapor clido o inhale el vapor de la ducha para aumentar la humedad del aire. Esto facilitar la respiracin.  Beba suficiente lquido para mantener la orina clara o de color amarillo plido.  Consuma sopas y otros caldos transparentes, y alimntese bien.  Descanse todo lo que sea necesario.  Regrese al trabajo cuando   la temperatura se le haya normalizado o cuando el mdico lo autorice. Es posible que deba quedarse en su casa durante un  tiempo prolongado, para no infectar a los dems. Tambin puede usar un barbijo y lavarse las manos con cuidado para evitar la propagacin del virus.  Aumente el uso del inhalador si tiene asma.  No consuma ningn producto que contenga tabaco, lo que incluye cigarrillos, tabaco de mascar o cigarrillos electrnicos. Si necesita ayuda para dejar de fumar, consulte al mdico. PREVENCIN La mejor manera de protegerse de un resfro es mantener una higiene adecuada.  Evite el contacto oral o fsico con personas que tengan sntomas de resfro.  En caso de contacto, lvese las manos con frecuencia. No hay pruebas claras de que la vitaminaC, la vitaminaE, la equincea o el ejercicio reduzcan la probabilidad de contraer un resfro. Sin embargo, siempre se recomienda descansar mucho, hacer ejercicio y alimentarse bien. SOLICITE ATENCIN MDICA SI:  Su estado empeora en lugar de mejorar.  Los medicamentos no logran controlar los sntomas.  Tiene escalofros.  La sensacin de falta de aire empeora.  Tiene mucosidad marrn o roja.  Tiene secrecin nasal amarilla o marrn.  Le duele la cara, especialmente al inclinarse hacia adelante.  Tiene fiebre.  Tiene los ganglios del cuello hinchados.  Siente dolor al tragar.  Tiene zonas blancas en la parte de atrs de la garganta. SOLICITE ATENCIN MDICA DE INMEDIATO SI:  Tiene sntomas intensos o persistentes de:  Dolor de cabeza.  Dolor de odos.  Dolor sinusal.  Dolor en el pecho.  Tiene enfermedad pulmonar crnica y cualquiera de estos sntomas:  Sibilancias.  Tos prolongada.  Tos con sangre.  Cambio en la mucosidad habitual.  Presenta rigidez en el cuello.  Tiene cambios en:  La visin.  La audicin.  El pensamiento.  El estado de nimo. ASEGRESE DE QUE:  Comprende estas instrucciones.  Controlar su afeccin.  Recibir ayuda de inmediato si no mejora o si empeora. Esta informacin no tiene como fin  reemplazar el consejo del mdico. Asegrese de hacerle al mdico cualquier pregunta que tenga. Document Released: 03/12/2005 Document Revised: 10/17/2014 Document Reviewed: 09/07/2013 Elsevier Interactive Patient Education  2017 Elsevier Inc.  

## 2016-07-28 NOTE — Progress Notes (Signed)
BP 100/72 (BP Location: Left Arm, Patient Position: Sitting, Cuff Size: Normal)   Pulse 98   Temp 97.9 F (36.6 C)   Ht 5\' 3"  (1.6 m)   Wt 242 lb 12 oz (110.1 kg)   SpO2 98%   BMI 43.00 kg/m    Subjective:    Patient ID: Heidi Gibson, female    DOB: 16-Jul-1965, 51 y.o.   MRN: [id number]  HPI: Heidi Gibson is a 51 y.o. female presenting on 07/28/2016 for Headache (pt states she had a cold last week, but it subsided. pt states she is unsure if she is getting something back. pt states sore throat, HA, bone ache, cough, sneezing, r ear ache. pt has not taken anything lately for her symptoms.)   HPI   Chief Complaint  Patient presents with  . Headache    pt states she had a cold last week, but it subsided. pt states she is unsure if she is getting something back. pt states sore throat, HA, bone ache, cough, sneezing, r ear ache. pt has not taken anything lately for her symptoms.     Pt states sick last week.  Got better and then it came back yesterday.  Worst thing is HA and ST.   Her son is sick also.  Relevant past medical, surgical, family and social history reviewed and updated as indicated. Interim medical history since our last visit reviewed. Allergies and medications reviewed and updated.  CURRENT MEDS: simvastatin  Review of Systems  Constitutional: Negative for appetite change, chills, diaphoresis, fatigue, fever and unexpected weight change.  HENT: Positive for ear pain. Negative for congestion, dental problem, drooling, facial swelling, hearing loss, mouth sores, sneezing, sore throat, trouble swallowing and voice change.   Eyes: Negative for pain, discharge, redness, itching and visual disturbance.  Respiratory: Negative for cough, choking, shortness of breath and wheezing.   Cardiovascular: Negative for chest pain, palpitations and leg swelling.  Gastrointestinal: Negative for abdominal pain, blood in stool, constipation, diarrhea and  vomiting.  Endocrine: Negative for cold intolerance, heat intolerance and polydipsia.  Genitourinary: Negative for decreased urine volume, dysuria and hematuria.  Musculoskeletal: Negative for arthralgias, back pain and gait problem.  Skin: Negative for rash.  Allergic/Immunologic: Negative for environmental allergies.  Neurological: Positive for headaches. Negative for seizures, syncope and light-headedness.  Hematological: Negative for adenopathy.  Psychiatric/Behavioral: Negative for agitation, dysphoric mood and suicidal ideas. The patient is not nervous/anxious.     Per HPI unless specifically indicated above     Objective:    BP 100/72 (BP Location: Left Arm, Patient Position: Sitting, Cuff Size: Normal)   Pulse 98   Temp 97.9 F (36.6 C)   Ht 5\' 3"  (1.6 m)   Wt 242 lb 12 oz (110.1 kg)   SpO2 98%   BMI 43.00 kg/m   Wt Readings from Last 3 Encounters:  07/28/16 242 lb 12 oz (110.1 kg)  02/25/16 242 lb (109.8 kg)  08/22/15 242 lb (109.8 kg)    Physical Exam  Constitutional: She is oriented to person, place, and time. She appears well-developed and well-nourished.  HENT:  Head: Normocephalic and atraumatic.  Right Ear: Hearing, tympanic membrane, external ear and ear canal normal.  Left Ear: Hearing, tympanic membrane, external ear and ear canal normal.  Nose: Nose normal.  Mouth/Throat: Uvula is midline and oropharynx is clear and moist. No oropharyngeal exudate.  Neck: Neck supple.  Cardiovascular: Normal rate and regular rhythm.   Pulmonary/Chest: Effort normal and breath  sounds normal. She has no wheezes.  Abdominal: Soft. Bowel sounds are normal. There is no tenderness.  Lymphadenopathy:    She has no cervical adenopathy.  Neurological: She is alert and oriented to person, place, and time.  Skin: Skin is warm and dry.  Psychiatric: She has a normal mood and affect. Her behavior is normal.  Vitals reviewed.       Assessment & Plan:    Encounter Diagnosis   Name Primary?  . Acute upper respiratory infection Yes     -discussed with pt likely passing URI around at home and got re-infected.  Recommended OTCs prn symptoms.  Gave tessalon and magic mouthwash.  Encouraged rest, fluids. -gave note for work -follow up as scheduled.  RTO sooner prn

## 2016-08-20 ENCOUNTER — Other Ambulatory Visit: Payer: Self-pay | Admitting: Student

## 2016-08-20 DIAGNOSIS — E785 Hyperlipidemia, unspecified: Secondary | ICD-10-CM

## 2016-08-24 LAB — LIPID PANEL
Cholesterol: 154 mg/dL (ref <200)
HDL: 48 mg/dL — ABNORMAL LOW (ref >50)
LDL CALC: 83 mg/dL (ref <100)
Total CHOL/HDL Ratio: 3.2 Ratio (ref <5.0)
Triglycerides: 117 mg/dL (ref <150)
VLDL: 23 mg/dL (ref <30)

## 2016-08-24 LAB — COMPREHENSIVE METABOLIC PANEL
ALBUMIN: 4 g/dL (ref 3.6–5.1)
ALT: 17 U/L (ref 6–29)
AST: 20 U/L (ref 10–35)
Alkaline Phosphatase: 116 U/L — ABNORMAL HIGH (ref 33–115)
BILIRUBIN TOTAL: 0.5 mg/dL (ref 0.2–1.2)
BUN: 10 mg/dL (ref 7–25)
CO2: 28 mmol/L (ref 20–31)
CREATININE: 0.66 mg/dL (ref 0.50–1.10)
Calcium: 8.6 mg/dL (ref 8.6–10.2)
Chloride: 105 mmol/L (ref 98–110)
GLUCOSE: 94 mg/dL (ref 65–99)
Potassium: 4.2 mmol/L (ref 3.5–5.3)
SODIUM: 141 mmol/L (ref 135–146)
Total Protein: 6.7 g/dL (ref 6.1–8.1)

## 2016-08-25 ENCOUNTER — Ambulatory Visit: Payer: Self-pay | Admitting: Physician Assistant

## 2016-08-25 ENCOUNTER — Encounter: Payer: Self-pay | Admitting: Physician Assistant

## 2016-08-25 VITALS — BP 126/80 | HR 96 | Temp 97.5°F | Ht 63.0 in | Wt 243.0 lb

## 2016-08-25 DIAGNOSIS — E785 Hyperlipidemia, unspecified: Secondary | ICD-10-CM

## 2016-08-25 NOTE — Progress Notes (Signed)
BP 126/80 (BP Location: Left Arm, Patient Position: Sitting, Cuff Size: Large)   Pulse 96   Temp 97.5 F (36.4 C) (Other (Comment))   Ht 5\' 3"  (1.6 m)   Wt 243 lb (110.2 kg)   SpO2 99%   BMI 43.05 kg/m    Subjective:    Patient ID: Heidi LollingMaria Delcarmen Hollenbach, female    DOB: [birth year], 51 y.o.   MRN: [id number]  HPI: Heidi Gibson is a 51 y.o. female presenting on 08/25/2016 for Hyperlipidemia and Gynecologic Exam   HPI  Pt says she is doing well.    Pt says she went to [facility] recently and was told her PAP wasn't due until next year.  Our records show her last PAP was in 2012 but she says she had one more recently than that.  Relevant past medical, surgical, family and social history reviewed and updated as indicated. Interim medical history since our last visit reviewed. Allergies and medications reviewed and updated.   Current Outpatient Prescriptions:  .  simvastatin (ZOCOR) 20 MG tablet, Take 1 tablet (20 mg total) by mouth daily. Tome una tableta por boca al dormir, Disp: 30 tablet, Rfl: 5   Review of Systems  Constitutional: Negative for appetite change, chills, diaphoresis, fatigue, fever and unexpected weight change.  HENT: Negative for congestion, dental problem, drooling, ear pain, facial swelling, hearing loss, mouth sores, sneezing, sore throat, trouble swallowing and voice change.   Eyes: Negative for pain, discharge, redness, itching and visual disturbance.  Respiratory: Negative for cough, choking, shortness of breath and wheezing.   Cardiovascular: Negative for chest pain, palpitations and leg swelling.  Gastrointestinal: Negative for abdominal pain, blood in stool, constipation, diarrhea and vomiting.  Endocrine: Negative for cold intolerance, heat intolerance and polydipsia.  Genitourinary: Negative for decreased urine volume, dysuria and hematuria.  Musculoskeletal: Negative for arthralgias, back pain and gait problem.  Skin: Negative for  rash.  Allergic/Immunologic: Negative for environmental allergies.  Neurological: Negative for seizures, syncope, light-headedness and headaches.  Hematological: Negative for adenopathy.  Psychiatric/Behavioral: Negative for agitation, dysphoric mood and suicidal ideas. The patient is not nervous/anxious.     Per HPI unless specifically indicated above     Objective:    BP 126/80 (BP Location: Left Arm, Patient Position: Sitting, Cuff Size: Large)   Pulse 96   Temp 97.5 F (36.4 C) (Other (Comment))   Ht 5\' 3"  (1.6 m)   Wt 243 lb (110.2 kg)   SpO2 99%   BMI 43.05 kg/m   Wt Readings from Last 3 Encounters:  08/25/16 243 lb (110.2 kg)  07/28/16 242 lb 12 oz (110.1 kg)  02/25/16 242 lb (109.8 kg)    Physical Exam  Constitutional: She is oriented to person, place, and time. She appears well-developed and well-nourished.  HENT:  Head: Normocephalic and atraumatic.  Neck: Neck supple.  Cardiovascular: Normal rate and regular rhythm.   Pulmonary/Chest: Effort normal and breath sounds normal.  Abdominal: Soft. Bowel sounds are normal. She exhibits no mass. There is no hepatosplenomegaly. There is no tenderness.  Musculoskeletal: She exhibits no edema.  Lymphadenopathy:    She has no cervical adenopathy.  Neurological: She is alert and oriented to person, place, and time.  Skin: Skin is warm and dry.  Psychiatric: She has a normal mood and affect. Her behavior is normal.  Vitals reviewed.   Results for orders placed or performed in visit on 08/20/16  Lipid Profile  Result Value Ref Range   Cholesterol 154 <200  mg/dL   Triglycerides 161 <096 mg/dL   HDL 48 (L) >04 mg/dL   Total CHOL/HDL Ratio 3.2 <5.0 Ratio   VLDL 23 <30 mg/dL   LDL Cholesterol 83 <540 mg/dL  Comprehensive Metabolic Panel (CMET)  Result Value Ref Range   Sodium 141 135 - 146 mmol/L   Potassium 4.2 3.5 - 5.3 mmol/L   Chloride 105 98 - 110 mmol/L   CO2 28 20 - 31 mmol/L   Glucose, Bld 94 65 - 99 mg/dL    BUN 10 7 - 25 mg/dL   Creat 9.81 1.91 - 4.78 mg/dL   Total Bilirubin 0.5 0.2 - 1.2 mg/dL   Alkaline Phosphatase 116 (H) 33 - 115 U/L   AST 20 10 - 35 U/L   ALT 17 6 - 29 U/L   Total Protein 6.7 6.1 - 8.1 g/dL   Albumin 4.0 3.6 - 5.1 g/dL   Calcium 8.6 8.6 - 29.5 mg/dL      Assessment & Plan:   Encounter Diagnoses  Name Primary?  . Hyperlipidemia, unspecified hyperlipidemia type Yes  . Morbid obesity, unspecified obesity type (HCC)      -reviewed labs with pt -pt to continue current medication -sent record request to [facility] for most recent PAP -pt will follow up in 3 months.  RTO sooner prn

## 2016-09-01 ENCOUNTER — Ambulatory Visit: Payer: Self-pay | Admitting: Physician Assistant

## 2016-09-01 ENCOUNTER — Encounter: Payer: Self-pay | Admitting: Physician Assistant

## 2016-09-01 VITALS — BP 126/78 | HR 98 | Temp 98.1°F | Ht 63.0 in | Wt 245.5 lb

## 2016-09-01 DIAGNOSIS — M545 Low back pain: Secondary | ICD-10-CM

## 2016-09-01 DIAGNOSIS — M79604 Pain in right leg: Secondary | ICD-10-CM

## 2016-09-01 MED ORDER — NAPROXEN 500 MG PO TABS: ORAL_TABLET | ORAL | 0 refills | Status: DC

## 2016-09-01 NOTE — Progress Notes (Signed)
BP 126/78 (BP Location: Left Arm, Patient Position: Sitting, Cuff Size: Large)   Pulse 98   Temp 98.1 F (36.7 C) (Other (Comment))   Ht 5\' 3"  (1.6 m)   Wt 245 lb 8 oz (111.4 kg)   SpO2 97%   BMI 43.49 kg/m    Subjective:    Patient ID: Heidi Gibson, female    DOB: 08-Sep-1965, 50 y.o.   MRN: [id number]  HPI: Heidi Gibson is a 51 y.o. female presenting on 09/01/2016 for Leg Pain (c/o right leg pain continuing up hip and back, injury occured at work Saturday)   HPI   Pt states she started with RLE pain after standing on her tip-toes all day Saturday.   She did not go in to work today b/c it hurt too much.   She did not takin any apap or ibu for the pain  Relevant past medical, surgical, family and social history reviewed and updated as indicated. Interim medical history since our last visit reviewed. Allergies and medications reviewed and updated.   Current Outpatient Prescriptions:  .  simvastatin (ZOCOR) 20 MG tablet, Take 1 tablet (20 mg total) by mouth daily. Tome una tableta por boca al dormir, Disp: 30 tablet, Rfl: 5   Review of Systems  Constitutional: Negative for appetite change, chills, diaphoresis, fatigue, fever and unexpected weight change.  HENT: Negative for congestion, dental problem, drooling, ear pain, facial swelling, hearing loss, mouth sores, sneezing, sore throat, trouble swallowing and voice change.   Eyes: Negative for pain, discharge, redness, itching and visual disturbance.  Respiratory: Negative for cough, choking, shortness of breath and wheezing.   Cardiovascular: Negative for chest pain, palpitations and leg swelling.  Gastrointestinal: Negative for abdominal pain, blood in stool, constipation, diarrhea and vomiting.  Endocrine: Negative for cold intolerance, heat intolerance and polydipsia.  Genitourinary: Negative for decreased urine volume, dysuria and hematuria.  Musculoskeletal: Positive for back pain. Negative  for arthralgias and gait problem.  Skin: Negative for rash.  Allergic/Immunologic: Negative for environmental allergies.  Neurological: Positive for headaches. Negative for seizures, syncope and light-headedness.  Hematological: Negative for adenopathy.  Psychiatric/Behavioral: Negative for agitation, dysphoric mood and suicidal ideas. The patient is not nervous/anxious.     Per HPI unless specifically indicated above     Objective:    BP 126/78 (BP Location: Left Arm, Patient Position: Sitting, Cuff Size: Large)   Pulse 98   Temp 98.1 F (36.7 C) (Other (Comment))   Ht 5\' 3"  (1.6 m)   Wt 245 lb 8 oz (111.4 kg)   SpO2 97%   BMI 43.49 kg/m   Wt Readings from Last 3 Encounters:  09/01/16 245 lb 8 oz (111.4 kg)  08/25/16 243 lb (110.2 kg)  07/28/16 242 lb 12 oz (110.1 kg)    Physical Exam  Constitutional: She is oriented to person, place, and time. She appears well-developed and well-nourished.  HENT:  Head: Normocephalic and atraumatic.  Neck: Neck supple.  Cardiovascular: Normal rate and regular rhythm.   Pulmonary/Chest: Effort normal and breath sounds normal.  Abdominal: Soft. Bowel sounds are normal. She exhibits no mass. There is no hepatosplenomegaly. There is no tenderness.  Musculoskeletal: She exhibits no edema.       Right hip: She exhibits normal range of motion, normal strength, no tenderness and no bony tenderness.       Left hip: Normal.       Lumbar back: She exhibits tenderness. She exhibits normal range of motion, no bony  tenderness, no swelling and no edema.  Some mild pain with ROM testing R hip  Lymphadenopathy:    She has no cervical adenopathy.  Neurological: She is alert and oriented to person, place, and time.  Skin: Skin is warm and dry.  Psychiatric: She has a normal mood and affect. Her behavior is normal.  Vitals reviewed.       Assessment & Plan:    Encounter Diagnoses  Name Primary?  . Right leg pain Yes  . Acute low back pain,  unspecified back pain laterality, with sciatica presence unspecified      -rx naproxen. -use heat/soak in bathtub -gave pt note to take to work requesting stool or other changes in work environment to help prevent further problems. -follow up as scheduled.  RTO sooner prn

## 2016-09-01 NOTE — Patient Instructions (Signed)
Dolor muscular en los adultos  (Muscle Pain, Adult)  El dolor muscular (mialgia) puede ser leve o intenso. La mayoría de las veces, el dolor dura solo un corto período y desaparece sin tratamiento. Es normal sentir algo de dolor muscular después de comenzar un programa de entrenamiento. Generalmente duelen aquellos músculos que no se utilizan con frecuencia.  Puede haber muchas otras causas del dolor muscular, que incluyen las siguientes:  · Uso excesivo del músculo o distensión muscular, en especial si la persona no está en buen estado físico. Esta es la causa más común del dolor muscular.  · Lesiones.  · Moretones.  · Virus, como el de la gripe.  · Enfermedades infecciosas.  · Una afección crónica que causa dolor de cabeza, fatiga y dolor muscular con la palpación (fibromialgia).  · Una afección, como el lupus, en la que el sistema del cuerpo encargado de combatir las enfermedades ataca a otros órganos (enfermedades autoinmunes o reumatológicas).  · Determinados medicamentos, como los inhibidores de la ECA y las estatinas.  Para diagnosticar la causa del dolor muscular, su médico le realizará un examen físico y le hará preguntas sobre el dolor y cuándo comenzó. Si el dolor muscular no comenzó hace mucho tiempo, el médico puede esperar antes de hacer diversas pruebas. Si el dolor muscular comenzó hace mucho tiempo, el médico puede decidir que es más conveniente hacer pruebas de inmediato. En algunos casos, se pueden realizar pruebas para descartar ciertas afecciones o enfermedades.  El tratamiento del dolor muscular depende de la causa. El cuidado en el hogar a menudo es suficiente para aliviar el dolor muscular. El médico también puede recetarle un medicamento antiinflamatorio.  INSTRUCCIONES PARA EL CUIDADO EN EL HOGAR  Actividad  · Si el uso excesivo del músculo está provocando dolor muscular:  ? Disminuya sus actividades hasta que el dolor desaparezca.  ? Si usted no hace actividad física con frecuencia, los  ejercicios deben ser suaves y regulares.  ? Realice precalentamiento antes de la actividad física. Elongue antes y después de hacer ejercicios. Esto puede ayudar a disminuir el riesgo de que sufra dolor muscular.  · No siga haciendo actividad física si el dolor es muy intenso. Este tipo de dolor podría indicar que se ha lesionado un músculo.  Control del dolor y de las molestias  · Si se lo indican, aplique hielo sobre el músculo dolorido:  ? Ponga el hielo en una bolsa plástica.  ? Coloque una toalla entre la piel y la bolsa de hielo.  ? Coloque el hielo durante 20 minutos, 2 a 3 veces por día.  · También puede alternar entre aplicar hielo y aplicar calor como se lo haya indicado el médico. Para aplicar calor, use la fuente de calor que el médico le recomiende, como una compresa de calor húmedo o una almohadilla térmica.  ? Coloque una toalla entre la piel y la fuente de calor.  ? Aplique el calor durante 20 a 30 minutos.  ? Retire la fuente de calor si la piel se le pone de color rojo brillante. Esto es muy importante si no puede sentir el dolor, el calor ni el frío. Puede correr un riesgo mayor de sufrir quemaduras.  Medicamentos  · Tome los medicamentos de venta libre y los recetados solamente como se lo haya indicado el médico.  · No conduzca ni use maquinaria pesada mientras toma analgésicos recetados.  SOLICITE ATENCIÓN MÉDICA SI:  · El dolor muscular empeora y los medicamentos no surten efecto.  · Tiene dolor muscular   que dura más de 3 días.  · Tiene una erupción cutánea o fiebre junto con el dolor muscular.  · Tiene dolor muscular después de una picadura de garrapata.  · Tiene dolor muscular mientras hace actividad física, aunque esté en buen estado físico.  · Tiene enrojecimiento, sensibilidad o hinchazón junto con el dolor muscular.  · Tiene dolor muscular después de comenzar un medicamento nuevo o de cambiar la dosis de un medicamento.    SOLICITE ATENCIÓN MÉDICA DE INMEDIATO SI:  · Tiene dificultad para  respirar.  · Presenta dificultad para tragar.  · Tiene dolor muscular junto con rigidez en el cuello, fiebre y vómitos.  · Tiene debilidad muscular intensa o no puede mover una parte del cuerpo.    Esta información no tiene como fin reemplazar el consejo del médico. Asegúrese de hacerle al médico cualquier pregunta que tenga.  Document Released: 09/09/2007 Document Revised: 09/24/2015 Document Reviewed: 10/23/2015  Elsevier Interactive Patient Education © 2017 [employer]

## 2016-11-13 ENCOUNTER — Other Ambulatory Visit (HOSPITAL_COMMUNITY)
Admission: RE | Admit: 2016-11-13 | Discharge: 2016-11-13 | Disposition: A | Discharge status: Home / Self Care | Payer: PRIVATE HEALTH INSURANCE | Source: Ambulatory Visit | Attending: Physician Assistant | Admitting: Physician Assistant

## 2016-11-13 ENCOUNTER — Other Ambulatory Visit: Payer: Self-pay | Admitting: Physician Assistant

## 2016-11-13 LAB — COMPREHENSIVE METABOLIC PANEL
ALBUMIN: 4 g/dL (ref 3.5–5.0)
ALT: 24 U/L (ref 14–54)
ANION GAP: 8 (ref 5–15)
AST: 23 U/L (ref 15–41)
Alkaline Phosphatase: 124 U/L (ref 38–126)
BUN: 13 mg/dL (ref 6–20)
CHLORIDE: 102 mmol/L (ref 101–111)
CO2: 30 mmol/L (ref 22–32)
Calcium: 9.1 mg/dL (ref 8.9–10.3)
Creatinine, Ser: 0.65 mg/dL (ref 0.44–1.00)
GFR calc Af Amer: >60 mL/min (ref >60)
Glucose, Bld: 100 mg/dL — ABNORMAL HIGH (ref 65–99)
POTASSIUM: 4 mmol/L (ref 3.5–5.1)
Sodium: 140 mmol/L (ref 135–145)
TOTAL PROTEIN: 7.7 g/dL (ref 6.5–8.1)
Total Bilirubin: 0.5 mg/dL (ref 0.3–1.2)

## 2016-11-13 LAB — LIPID PANEL
CHOL/HDL RATIO: 4.1 ratio
CHOLESTEROL: 188 mg/dL (ref 0–200)
HDL: 46 mg/dL (ref >40)
LDL Cholesterol: 103 mg/dL — ABNORMAL HIGH (ref 0–99)
Triglycerides: 195 mg/dL — ABNORMAL HIGH (ref <150)
VLDL: 39 mg/dL (ref 0–40)

## 2016-11-13 MED ORDER — SIMVASTATIN 20 MG PO TABS: 20.0000 mg | ORAL_TABLET | Freq: Every day | ORAL | 5 refills | Status: DC

## 2016-11-26 ENCOUNTER — Encounter: Payer: Self-pay | Admitting: Physician Assistant

## 2016-11-26 ENCOUNTER — Ambulatory Visit: Payer: Self-pay | Admitting: Physician Assistant

## 2016-11-26 ENCOUNTER — Other Ambulatory Visit: Payer: Self-pay | Admitting: Physician Assistant

## 2016-11-26 VITALS — BP 116/60 | HR 97 | Temp 98.1°F | Ht 63.0 in | Wt 244.5 lb

## 2016-11-26 DIAGNOSIS — Z1211 Encounter for screening for malignant neoplasm of colon: Secondary | ICD-10-CM

## 2016-11-26 DIAGNOSIS — E785 Hyperlipidemia, unspecified: Secondary | ICD-10-CM

## 2016-11-26 NOTE — Progress Notes (Signed)
BP 116/60 (BP Location: Left Arm, Patient Position: Sitting, Cuff Size: Large)   Pulse 97   Temp 98.1 F (36.7 C)   Ht 5\' 3"  (1.6 m)   Wt 244 lb 8 oz (110.9 kg)   SpO2 98%   BMI 43.31 kg/m    Subjective:    Patient ID: Heidi Gibson, female    DOB: 02-Mar-1966, 51 y.o.   MRN: [id number]  HPI: Heidi LollingMaria Delcarmen Sarratt is a 51 y.o. female presenting on 11/26/2016 for Hyperlipidemia   HPI   Pt is doing well and has no complaints  Relevant past medical, surgical, family and social history reviewed and updated as indicated. Interim medical history since our last visit reviewed. Allergies and medications reviewed and updated.   Current Outpatient Prescriptions:  .  simvastatin (ZOCOR) 20 MG tablet, Take 1 tablet (20 mg total) by mouth daily. Tome una tableta por boca al dormir, Disp: 30 tablet, Rfl: 5   Review of Systems  Constitutional: Negative for appetite change, chills, diaphoresis, fatigue, fever and unexpected weight change.  HENT: Negative for congestion, dental problem, drooling, ear pain, facial swelling, hearing loss, mouth sores, sneezing, sore throat, trouble swallowing and voice change.   Eyes: Negative for pain, discharge, redness, itching and visual disturbance.  Respiratory: Negative for cough, choking, shortness of breath and wheezing.   Cardiovascular: Negative for chest pain, palpitations and leg swelling.  Gastrointestinal: Negative for abdominal pain, blood in stool, constipation, diarrhea and vomiting.  Endocrine: Negative for cold intolerance, heat intolerance and polydipsia.  Genitourinary: Negative for decreased urine volume, dysuria and hematuria.  Musculoskeletal: Negative for arthralgias, back pain and gait problem.  Skin: Negative for rash.  Allergic/Immunologic: Negative for environmental allergies.  Neurological: Negative for seizures, syncope, light-headedness and headaches.  Hematological: Negative for adenopathy.   Psychiatric/Behavioral: Negative for agitation, dysphoric mood and suicidal ideas. The patient is not nervous/anxious.     Per HPI unless specifically indicated above     Objective:    BP 116/60 (BP Location: Left Arm, Patient Position: Sitting, Cuff Size: Large)   Pulse 97   Temp 98.1 F (36.7 C)   Ht 5\' 3"  (1.6 m)   Wt 244 lb 8 oz (110.9 kg)   SpO2 98%   BMI 43.31 kg/m   Wt Readings from Last 3 Encounters:  11/26/16 244 lb 8 oz (110.9 kg)  09/01/16 245 lb 8 oz (111.4 kg)  08/25/16 243 lb (110.2 kg)    Physical Exam  Constitutional: She is oriented to person, place, and time. She appears well-developed and well-nourished.  HENT:  Head: Normocephalic and atraumatic.  Neck: Neck supple.  Cardiovascular: Normal rate and regular rhythm.   Pulmonary/Chest: Effort normal and breath sounds normal.  Abdominal: Soft. Bowel sounds are normal. She exhibits no mass. There is no hepatosplenomegaly. There is no tenderness.  Musculoskeletal: She exhibits no edema.  Lymphadenopathy:    She has no cervical adenopathy.  Neurological: She is alert and oriented to person, place, and time.  Skin: Skin is warm and dry.  Psychiatric: She has a normal mood and affect. Her behavior is normal.  Vitals reviewed.   Results for orders placed or performed during the hospital encounter of 11/13/16  Comprehensive metabolic panel  Result Value Ref Range   Sodium 140 135 - 145 mmol/L   Potassium 4.0 3.5 - 5.1 mmol/L   Chloride 102 101 - 111 mmol/L   CO2 30 22 - 32 mmol/L   Glucose, Bld 100 (H) 65 -  99 mg/dL   BUN 13 6 - 20 mg/dL   Creatinine, Ser 0.98 0.44 - 1.00 mg/dL   Calcium 9.1 8.9 - 11.9 mg/dL   Total Protein 7.7 6.5 - 8.1 g/dL   Albumin 4.0 3.5 - 5.0 g/dL   AST 23 15 - 41 U/L   ALT 24 14 - 54 U/L   Alkaline Phosphatase 124 38 - 126 U/L   Total Bilirubin 0.5 0.3 - 1.2 mg/dL   GFR calc non Af Amer >60 >60 mL/min   GFR calc Af Amer >60 >60 mL/min   Anion gap 8 5 - 15  Lipid panel   Result Value Ref Range   Cholesterol 188 0 - 200 mg/dL   Triglycerides 147 (H) <150 mg/dL   HDL 46 >82 mg/dL   Total CHOL/HDL Ratio 4.1 RATIO   VLDL 39 0 - 40 mg/dL   LDL Cholesterol 956 (H) 0 - 99 mg/dL      Assessment & Plan:   Encounter Diagnoses  Name Primary?  . Hyperlipidemia, unspecified hyperlipidemia type Yes  . Screening for colon cancer   . Morbid obesity, unspecified obesity type (HCC)     -reviewed labs with pt -counseled pt to Follow lowfat diet and gave handout -pt will Continue current medications -Gave iFOBT for colon cancer screening -pt will follow up in 3 months with PAP at that appt. RTO sooner prn

## 2016-11-26 NOTE — Patient Instructions (Signed)
Dieta restringida en grasas y colesterol (Fat and Cholesterol Restricted Diet) Los niveles altos de grasa y colesterol en la sangre pueden causar varios problemas de salud, como enfermedades del corazn, de los vasos sanguneos, de la vescula, del hgado y del pncreas. Las grasas son fuentes de energa concentrada que existen en varias formas. Consumir en exceso ciertos tipos de grasa, incluidas las grasas saturadas, puede ser perjudicial. El colesterol es una sustancia que el organismo necesita en pequeas cantidades. El cuerpo fabrica todo el colesterol que necesita. El exceso de colesterol proviene de los alimentos que come. Si sus niveles de colesterol y grasas saturadas en la sangre son elevados, puede tener problemas de salud, dado que el exceso de grasa y colesterol se acumula en las paredes de los vasos sanguneos, provocando su estrechamiento. Elegir los alimentos apropiados le permitir controlar su ingesta de grasa y colesterol. Esto le ayudar a mantener los niveles de estas sustancias en la sangre dentro de los lmites normales y a reducir el riesgo de contraer enfermedades. EN QU CONSISTE EL PLAN? El mdico le recomienda que:  Limite la ingesta de grasas a alrededor del _______% del total de caloras por da.  Limite la cantidad de colesterol en su dieta a menos de _________mg por da.  Consuma entre 20 y 30gramos de fibra todos los das. QU TIPOS DE GRASAS DEBO ELEGIR?  Elija grasas saludables con mayor frecuencia. Elija las grasas monoinsaturadas y poliinsaturadas, como el aceite de oliva y canola, las semillas de lino, las nueces, las almendras y las semillas.  Consuma ms grasas omega-3. Las mejores opciones incluyen salmn, caballa, sardinas, atn, aceite de lino y semillas de lino molidas. Trate de consumir pescado al menos dos veces por semana.  Limite el consumo de grasas saturadas. Estas se encuentran principalmente en los productos de origen animal, como las carnes,  la mantequilla y la crema. Las grasas saturadas de origen vegetal incluyen aceite de palma, de palmiste y de coco.  Evite los alimentos con aceites parcialmente hidrogenados. Estos contienen grasas trans. Entre los ejemplos de alimentos con grasas trans se incluyen margarinas en barra, algunas margarinas untables, galletas dulces o saladas y otros productos horneados.  QU PAUTAS GENERALES DEBO SEGUIR? Estas pautas para una alimentacin saludable le ayudarn a controlar su ingesta de grasa y colesterol:  Lea las etiquetas de los alimentos detenidamente para identificar los que contienen grasas trans o altas cantidades de grasas saturadas.  Llene la mitad del plato con verduras y ensaladas de hojas verdes.  Llene un cuarto del plato con cereales integrales. Busque la palabra "integral" en el primer lugar de la lista de ingredientes.  Llene un cuarto del plato con alimentos con protenas magras.  Limite las frutas a dos porciones por da. Elija frutas en lugar de jugos.  Coma ms alimentos que contienen fibra, como manzanas, brcoli, zanahorias, frijoles, guisantes y cebada.  Consuma ms comida casera y menos de restaurante, de buf y comida rpida.  Limite o evite el alcohol.  Limite los alimentos con alto contenido de almidn y azcar.  Limite el consumo de alimentos fritos.  Cocine los alimentos utilizando mtodos que no sean la fritura. Las opciones de coccin ms adecuadas son hornear, hervir, grillar y asar a la parrilla.  Baje de peso si es necesario. Perder solo del 5 al 10% de su peso inicial puede ayudarle a mejorar su estado de salud general y a prevenir enfermedades, como la diabetes y las enfermedades cardacas. QU ALIMENTOS PUEDO COMER? Cereales Cereales integrales,   como los panes de salvado o integrales, las galletas, los cereales y las pastas. Avena sin endulzar, trigo, cebada, quinua o arroz integral. Tortillas de harina de maz o de salvado. Verduras Verduras  frescas o congeladas (crudas, al vapor, asadas o grilladas). Ensaladas de hojas verdes. Frutas Frutas frescas, en conserva (en su jugo natural) o frutas congeladas. Carnes y otros productos con protenas Carne de res molida (al 85% o ms magra), carne de res de animales alimentados con pastos o carne de res sin la grasa. Pollo o pavo sin piel. Carne de pollo o de pavo molida. Cerdo sin la grasa. Todos los pescados y frutos de mar. Huevos. Porotos, guisantes o lentejas secos. Frutos secos o semillas sin sal. Frijoles secos o en lata sin sal. Lcteos Productos lcteos con bajo contenido de grasas, como leche descremada o al 1%, quesos reducidos en grasas o al 2%, ricota con bajo contenido de grasas o queso cottage, o yogur natural con bajo contenido de grasas. Grasas y aceites Margarinas untables que no contengan grasas trans. [place] y condimentos para ensaladas livianos o reducidos en grasas. [place]. Aceites de oliva, canola, ssamo o crtamo. Mantequilla natural de cacahuate o almendra (elija la que no tenga agregado de aceite o azcar). Los artculos mencionados arriba pueden no ser una lista completa de las bebidas o los alimentos recomendados. Comunquese con el nutricionista para conocer ms opciones. QU ALIMENTOS NO SE RECOMIENDAN? Cereales Pan blanco. Pastas blancas. Arroz blanco. Pan de maz. Bagels, pasteles y croissants. Galletas saladas que contengan grasas trans. Verduras Papas blancas. Maz. Verduras con crema o fritas. Verduras en salsa de queso. Frutas Frutas secas. Fruta enlatada en almbar liviano o espeso. Jugo de frutas. Carnes y otros productos con protenas Cortes de carne con grasa. Costillas, alas de pollo, tocineta, salchicha, mortadela, salame, chinchulines, tocino, perros calientes, salchichas alemanas y embutidos envasados. Hgado y otros rganos. Lcteos Leche entera o al 2%, crema, mezcla de leche y crema, y queso crema. Quesos enteros. Yogur entero o  endulzado. Quesos con toda su grasa. Cremas no lcteas y coberturas batidas. Quesos procesados, quesos para untar o cuajadas. Dulces y postres Jarabe de maz, azcares, miel y melazas. Caramelos. Mermelada y jalea. Jarabe. Cereales endulzados. Galletas, pasteles, bizcochuelos, donas, muffins y helado. Grasas y aceites Mantequilla, margarina en barra, manteca de cerdo, grasa, mantequilla clarificada o grasa de tocino. Aceites de coco, de palmiste o de palma. Bebidas Alcohol. Bebidas endulzadas (como refrescos, limonadas y bebidas frutales o ponches). Los artculos mencionados arriba pueden no ser una lista completa de las bebidas y los alimentos que se deben evitar. Comunquese con el nutricionista para obtener ms informacin. Esta informacin no tiene como fin reemplazar el consejo del mdico. Asegrese de hacerle al mdico cualquier pregunta que tenga. Document Released: 06/02/2005 Document Revised: 06/23/2014 Document Reviewed: 08/31/2013 Elsevier Interactive Patient Education  2017 [employer]  

## 2016-12-04 ENCOUNTER — Encounter: Payer: Self-pay | Admitting: Physician Assistant

## 2017-02-25 ENCOUNTER — Ambulatory Visit: Payer: Self-pay | Admitting: Physician Assistant

## 2017-03-02 ENCOUNTER — Ambulatory Visit: Payer: Self-pay | Admitting: Physician Assistant

## 2017-03-03 ENCOUNTER — Ambulatory Visit: Payer: Self-pay | Admitting: Physician Assistant

## 2017-03-03 ENCOUNTER — Encounter: Payer: Self-pay | Admitting: Physician Assistant

## 2017-03-03 ENCOUNTER — Other Ambulatory Visit (HOSPITAL_COMMUNITY)
Admission: RE | Admit: 2017-03-03 | Discharge: 2017-03-03 | Disposition: A | Discharge status: Home / Self Care | Payer: PRIVATE HEALTH INSURANCE | Source: Ambulatory Visit | Attending: Physician Assistant | Admitting: Physician Assistant

## 2017-03-03 ENCOUNTER — Other Ambulatory Visit: Payer: Self-pay | Admitting: Physician Assistant

## 2017-03-03 VITALS — BP 106/78 | HR 94 | Temp 98.1°F | Ht 63.0 in | Wt 247.8 lb

## 2017-03-03 DIAGNOSIS — E785 Hyperlipidemia, unspecified: Secondary | ICD-10-CM

## 2017-03-03 DIAGNOSIS — Z124 Encounter for screening for malignant neoplasm of cervix: Secondary | ICD-10-CM

## 2017-03-03 DIAGNOSIS — Z01419 Encounter for gynecological examination (general) (routine) without abnormal findings: Secondary | ICD-10-CM | POA: Insufficient documentation

## 2017-03-03 DIAGNOSIS — Z1211 Encounter for screening for malignant neoplasm of colon: Secondary | ICD-10-CM

## 2017-03-03 LAB — IFOBT (OCCULT BLOOD): IFOBT: NEGATIVE

## 2017-03-03 NOTE — Progress Notes (Signed)
BP 106/78 (BP Location: Left Arm, Patient Position: Sitting, Cuff Size: Large)   Pulse 94   Temp 98.1 F (36.7 C)   Ht  (1.6 m)   Wt 247 lb 12 oz (112.4 kg)   LMP 06/16/2008   SpO2 98%   BMI 43.89 kg/m    Subjective:    Patient ID: Heidi Gibson, female    DOB: 11-23-1965, 51 y.o.   MRN: [id number]  HPI: Heidi Gibson is a 51 y.o. female presenting on 03/03/2017 for Gynecologic Exam and Hyperlipidemia   HPI   Pt did not get her labs drawn.  She says she didn't get a phone call  LMP 2010-   Pt is feeling well  Relevant past medical, surgical, family and social history reviewed and updated as indicated. Interim medical history since our last visit reviewed. Allergies and medications reviewed and updated.   Current Outpatient Prescriptions:  Marland Kitchen  Misc Natural Products (CALCIUM PLUS ADVANCED PO), Take 1 tablet by mouth daily., Disp: , Rfl:  .  simvastatin (ZOCOR) 20 MG tablet, Take 1 tablet (20 mg total) by mouth daily. Tome una tableta por boca al dormir, Disp: 30 tablet, Rfl: 5   Review of Systems  Constitutional: Negative for appetite change, chills, diaphoresis, fatigue, fever and unexpected weight change.  HENT: Negative for congestion, dental problem, drooling, ear pain, facial swelling, hearing loss, mouth sores, sneezing, sore throat, trouble swallowing and voice change.   Eyes: Negative for pain, discharge, redness, itching and visual disturbance.  Respiratory: Negative for cough, choking, chest tightness, shortness of breath and wheezing.   Cardiovascular: Negative for chest pain, palpitations and leg swelling.  Gastrointestinal: Negative for abdominal pain, blood in stool, constipation, diarrhea and vomiting.  Endocrine: Negative for cold intolerance, heat intolerance and polydipsia.  Genitourinary: Negative for decreased urine volume, dysuria and hematuria.  Musculoskeletal: Negative for arthralgias, back pain and gait problem.   Skin: Negative for rash.  Allergic/Immunologic: Negative for environmental allergies.  Neurological: Negative for seizures, syncope, light-headedness and headaches.  Hematological: Negative for adenopathy.  Psychiatric/Behavioral: Negative for agitation, dysphoric mood and suicidal ideas. The patient is not nervous/anxious.     Per HPI unless specifically indicated above     Objective:    BP 106/78 (BP Location: Left Arm, Patient Position: Sitting, Cuff Size: Large)   Pulse 94   Temp 98.1 F (36.7 C)   Ht  (1.6 m)   Wt 247 lb 12 oz (112.4 kg)   LMP 06/16/2008   SpO2 98%   BMI 43.89 kg/m   Wt Readings from Last 3 Encounters:  03/03/17 247 lb 12 oz (112.4 kg)  11/26/16 244 lb 8 oz (110.9 kg)  09/01/16 245 lb 8 oz (111.4 kg)    Physical Exam  Constitutional: She is oriented to person, place, and time. She appears well-developed and well-nourished.  HENT:  Head: Normocephalic and atraumatic.  Neck: Neck supple.  Cardiovascular: Normal rate and regular rhythm.   Pulmonary/Chest: Effort normal and breath sounds normal.  Breast exam normal  Abdominal: Soft. Bowel sounds are normal. She exhibits no mass. There is no hepatosplenomegaly. There is no tenderness. There is no rebound and no guarding.  Genitourinary: Vagina normal and uterus normal. No breast swelling, tenderness, discharge or bleeding. There is no rash, tenderness or lesion on the right labia. There is no rash, tenderness or lesion on the left labia. Cervix exhibits no motion tenderness, no discharge and no friability. Right adnexum displays no mass, no  tenderness and no fullness. Left adnexum displays no mass, no tenderness and no fullness.  Genitourinary Comments: (nurse Berenice assisted)  Musculoskeletal: She exhibits no edema.  Lymphadenopathy:    She has no cervical adenopathy.  Neurological: She is alert and oriented to person, place, and time.  Skin: Skin is warm and dry.  Psychiatric: She has a normal  mood and affect. Her behavior is normal.  Nursing note and vitals reviewed.       Assessment & Plan:    Encounter Diagnoses  Name Primary?  . Pap smear for cervical cancer screening Yes  . Hyperlipidemia, unspecified hyperlipidemia type   . Morbid obesity, unspecified obesity type (HCC)     -pt to get fasting labs drawn this week.  We will call pt with results -pt to continue current medication -pt to follow up in 4 months.  RTO sooner prn

## 2017-03-06 ENCOUNTER — Other Ambulatory Visit (HOSPITAL_COMMUNITY)
Admission: RE | Admit: 2017-03-06 | Discharge: 2017-03-06 | Disposition: A | Discharge status: Home / Self Care | Payer: PRIVATE HEALTH INSURANCE | Source: Ambulatory Visit | Attending: Physician Assistant | Admitting: Physician Assistant

## 2017-03-06 DIAGNOSIS — E785 Hyperlipidemia, unspecified: Secondary | ICD-10-CM | POA: Insufficient documentation

## 2017-03-06 LAB — COMPREHENSIVE METABOLIC PANEL
ALBUMIN: 4.1 g/dL (ref 3.5–5.0)
ALT: 20 U/L (ref 14–54)
AST: 24 U/L (ref 15–41)
Alkaline Phosphatase: 114 U/L (ref 38–126)
Anion gap: 8 (ref 5–15)
BILIRUBIN TOTAL: 0.7 mg/dL (ref 0.3–1.2)
BUN: 10 mg/dL (ref 6–20)
CHLORIDE: 103 mmol/L (ref 101–111)
CO2: 29 mmol/L (ref 22–32)
CREATININE: 0.68 mg/dL (ref 0.44–1.00)
Calcium: 9 mg/dL (ref 8.9–10.3)
GFR calc Af Amer: >60 mL/min (ref >60)
GFR calc non Af Amer: >60 mL/min (ref >60)
GLUCOSE: 101 mg/dL — AB (ref 65–99)
POTASSIUM: 4.1 mmol/L (ref 3.5–5.1)
Sodium: 140 mmol/L (ref 135–145)
TOTAL PROTEIN: 7.7 g/dL (ref 6.5–8.1)

## 2017-03-06 LAB — LIPID PANEL
Cholesterol: 185 mg/dL (ref 0–200)
HDL: 47 mg/dL (ref >40)
LDL Cholesterol: 104 mg/dL — ABNORMAL HIGH (ref 0–99)
Total CHOL/HDL Ratio: 3.9 RATIO
Triglycerides: 168 mg/dL — ABNORMAL HIGH (ref <150)
VLDL: 34 mg/dL (ref 0–40)

## 2017-03-09 ENCOUNTER — Ambulatory Visit: Payer: Self-pay | Admitting: Physician Assistant

## 2017-03-09 ENCOUNTER — Encounter: Payer: Self-pay | Admitting: Physician Assistant

## 2017-03-09 VITALS — BP 116/80 | HR 94 | Temp 97.9°F | Ht 63.0 in | Wt 260.0 lb

## 2017-03-09 DIAGNOSIS — Z131 Encounter for screening for diabetes mellitus: Secondary | ICD-10-CM

## 2017-03-09 DIAGNOSIS — R739 Hyperglycemia, unspecified: Secondary | ICD-10-CM

## 2017-03-09 DIAGNOSIS — M25512 Pain in left shoulder: Secondary | ICD-10-CM

## 2017-03-09 DIAGNOSIS — E785 Hyperlipidemia, unspecified: Secondary | ICD-10-CM

## 2017-03-09 MED ORDER — DICLOFENAC SODIUM 75 MG PO TBEC: DELAYED_RELEASE_TABLET | ORAL | 0 refills | Status: DC

## 2017-03-09 MED ORDER — CYCLOBENZAPRINE HCL 10 MG PO TABS: ORAL_TABLET | ORAL | 0 refills | Status: DC

## 2017-03-09 NOTE — Progress Notes (Signed)
BP 116/80 (BP Location: Left Arm, Patient Position: Sitting, Cuff Size: Normal)   Pulse 94   Temp 97.9 F (36.6 C)   Ht  (1.6 m)   Wt 260 lb (117.9 kg)   LMP 06/16/2008   SpO2 93%   BMI 46.06 kg/m    Subjective:    Patient ID: Heidi Gibson, female    DOB: [birth year], 51 y.o.   MRN: [id number]  HPI: Heidi Gibson is a 51 y.o. female presenting on 03/09/2017 for Pain (in neck and arm. pain began on Saturday 03-07-17. pt states taking tylenol pt states little helpful.)   HPI   Chief Complaint  Patient presents with  . Pain    in neck and arm. pain began on Saturday 03-07-17. pt states taking tylenol pt states little helpful.    Pain started gradually. It came and gone Sunday but today has been constant.  She cant fully move her LUE.  No injury  Relevant past medical, surgical, family and social history reviewed and updated as indicated. Interim medical history since our last visit reviewed. Allergies and medications reviewed and updated.   Current Outpatient Prescriptions:  Marland Kitchen  Misc Natural Products (CALCIUM PLUS ADVANCED PO), Take 1 tablet by mouth daily., Disp: , Rfl:  .  simvastatin (ZOCOR) 20 MG tablet, Take 1 tablet (20 mg total) by mouth daily. Tome una tableta por boca al dormir, Disp: 30 tablet, Rfl: 5   Review of Systems  Constitutional: Negative for appetite change, chills, diaphoresis, fatigue, fever and unexpected weight change.  HENT: Negative for congestion, dental problem, drooling, ear pain, facial swelling, hearing loss, mouth sores, sneezing, sore throat, trouble swallowing and voice change.   Eyes: Negative for pain, discharge, redness, itching and visual disturbance.  Respiratory: Negative for cough, choking, shortness of breath and wheezing.   Cardiovascular: Negative for chest pain, palpitations and leg swelling.  Gastrointestinal: Negative for abdominal pain, blood in stool, constipation, diarrhea and vomiting.   Endocrine: Negative for cold intolerance, heat intolerance and polydipsia.  Genitourinary: Negative for decreased urine volume, dysuria and hematuria.  Musculoskeletal: Positive for arthralgias and back pain. Negative for gait problem.  Skin: Negative for rash.  Allergic/Immunologic: Negative for environmental allergies.  Neurological: Positive for headaches. Negative for seizures, syncope and light-headedness.  Hematological: Negative for adenopathy.  Psychiatric/Behavioral: Negative for agitation, dysphoric mood and suicidal ideas. The patient is not nervous/anxious.     Per HPI unless specifically indicated above     Objective:    BP 116/80 (BP Location: Left Arm, Patient Position: Sitting, Cuff Size: Normal)   Pulse 94   Temp 97.9 F (36.6 C)   Ht  (1.6 m)   Wt 260 lb (117.9 kg)   LMP 06/16/2008   SpO2 93%   BMI 46.06 kg/m   Wt Readings from Last 3 Encounters:  03/09/17 260 lb (117.9 kg)  03/03/17 247 lb 12 oz (112.4 kg)  11/26/16 244 lb 8 oz (110.9 kg)    Physical Exam  Constitutional: She is oriented to person, place, and time. She appears well-developed and well-nourished.  HENT:  Head: Normocephalic and atraumatic.  Neck: Neck supple.  Cardiovascular: Normal rate and regular rhythm.   Pulmonary/Chest: Effort normal and breath sounds normal.  Abdominal: Soft. Bowel sounds are normal. She exhibits no mass. There is no hepatosplenomegaly. There is no tenderness.  Musculoskeletal: She exhibits no edema.       Left shoulder: She exhibits decreased range of motion and tenderness. She  exhibits no bony tenderness, no swelling, no effusion, no crepitus and no deformity.  L shoulder with anterior tenderness.  The extension is good to almost 90 deg.  Abduction is limited to about 45 deg due to pain.  Pt int rot limited due to pain.    Lymphadenopathy:    She has no cervical adenopathy.  Neurological: She is alert and oriented to person, place, and time.  Skin: Skin is  warm and dry.  Psychiatric: She has a normal mood and affect. Her behavior is normal.  Vitals reviewed.   Results for orders placed or performed during the hospital encounter of 03/06/17  Comprehensive metabolic panel  Result Value Ref Range   Sodium 140 135 - 145 mmol/L   Potassium 4.1 3.5 - 5.1 mmol/L   Chloride 103 101 - 111 mmol/L   CO2 29 22 - 32 mmol/L   Glucose, Bld 101 (H) 65 - 99 mg/dL   BUN 10 6 - 20 mg/dL   Creatinine, Ser 6.96 0.44 - 1.00 mg/dL   Calcium 9.0 8.9 - 29.5 mg/dL   Total Protein 7.7 6.5 - 8.1 g/dL   Albumin 4.1 3.5 - 5.0 g/dL   AST 24 15 - 41 U/L   ALT 20 14 - 54 U/L   Alkaline Phosphatase 114 38 - 126 U/L   Total Bilirubin 0.7 0.3 - 1.2 mg/dL   GFR calc non Af Amer >60 >60 mL/min   GFR calc Af Amer >60 >60 mL/min   Anion gap 8 5 - 15  Lipid Profile  Result Value Ref Range   Cholesterol 185 0 - 200 mg/dL   Triglycerides 284 (H) <150 mg/dL   HDL 47 >13 mg/dL   Total CHOL/HDL Ratio 3.9 RATIO   VLDL 34 0 - 40 mg/dL   LDL Cholesterol 244 (H) 0 - 99 mg/dL      Assessment & Plan:    Encounter Diagnoses  Name Primary?  . Hyperlipidemia, unspecified hyperlipidemia type Yes  . Morbid obesity, unspecified obesity type (HCC)   . Acute pain of left shoulder   . Screening for diabetes mellitus   . Hyperglycemia      -Reviewed labs with pt -rx flexeril and diclofenac -Heat/ice to shoulder -Pendulum swings .  Pt counseled to avoid overuse or overhead activites for 1-2 wk.  -pt to continue current medications -pt to follow up 4 months.  RTO sooner prn

## 2017-03-11 ENCOUNTER — Encounter: Payer: Self-pay | Admitting: Physician Assistant

## 2017-03-16 ENCOUNTER — Ambulatory Visit: Payer: Self-pay | Admitting: Physician Assistant

## 2017-03-16 ENCOUNTER — Encounter: Payer: Self-pay | Admitting: Physician Assistant

## 2017-05-26 ENCOUNTER — Ambulatory Visit: Payer: Self-pay | Admitting: Physician Assistant

## 2017-05-26 ENCOUNTER — Encounter: Payer: Self-pay | Admitting: Physician Assistant

## 2017-05-26 VITALS — BP 117/80 | HR 95 | Temp 97.7°F | Ht 63.0 in | Wt 249.0 lb

## 2017-05-26 DIAGNOSIS — M25512 Pain in left shoulder: Secondary | ICD-10-CM

## 2017-05-26 DIAGNOSIS — M545 Low back pain, unspecified: Secondary | ICD-10-CM

## 2017-05-26 MED ORDER — DICLOFENAC SODIUM 75 MG PO TBEC: DELAYED_RELEASE_TABLET | ORAL | 1 refills | Status: DC

## 2017-05-26 NOTE — Progress Notes (Signed)
BP 117/80 (BP Location: Right Arm, Patient Position: Sitting, Cuff Size: Large)   Pulse 95   Temp 97.7 F (36.5 C)   Ht 5\' 3"  (1.6 m)   Wt 249 lb (112.9 kg)   LMP 06/16/2008   SpO2 97%   BMI 44.11 kg/m    Subjective:    Patient ID: Heidi Gibson, female    DOB: Sep 08, 1965, 51 y.o.   MRN: [id number]  HPI: Heidi LollingMaria Delcarmen Cerezo is a 51 y.o. female presenting on 05/26/2017 for Back Pain (R sided lower back pain for 1 week) and Shoulder Pain (pt states swelling on upper arm. pt taking rx flexeril given by PA back in Sept. pt states it is helpful, but makes her drowsy)   HPI   Chief Complaint  Patient presents with  . Back Pain    R sided lower back pain for 1 week  . Shoulder Pain    pt states swelling on upper arm. pt taking rx flexeril given by PA back in Sept. pt states it is helpful, but makes her drowsy     Pt states back pain started about a week ago  and shoulder pain has returned- she was seen for L shoulder pain several months ago.   Pain worsens with mov't.  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  States limited ROM shoulder after work. 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    Relevant past medical, surgical, family and social history reviewed and updated as indicated. Interim medical history since our last visit reviewed. Allergies and medications reviewed and updated.   Current Outpatient Medications:  .  cyclobenzaprine (FLEXERIL) 10 MG tablet, 1 po q 8 hour prn muscle spasm.  1 tableta por boca cada 8 horas cuando sea necesario para espasmos muscularios., Disp: 30 tablet, Rfl: 0 .  Misc Natural Products (CALCIUM PLUS ADVANCED PO), Take 1 tablet by mouth daily., Disp: , Rfl:  .  simvastatin (ZOCOR) 20 MG tablet, Take 1 tablet (20 mg total) by mouth daily. Tome una tableta por boca al dormir, Disp: 30 tablet, Rfl: 5 .  diclofenac (VOLTAREN) 75 MG EC tablet, 1 po bid with  food.  Tome una tableta por boca dos veces diarias con comida (Patient not taking: Reported on 05/26/2017), Disp: 60 tablet, Rfl: 0   Review of Systems  Constitutional: Negative for appetite change, chills, diaphoresis, fatigue, fever and unexpected weight change.  HENT: Negative for congestion, drooling, ear pain, facial swelling, hearing loss, mouth sores, sneezing, sore throat, trouble swallowing and voice change.   Eyes: Negative for pain, discharge, redness, itching and visual disturbance.  Respiratory: Negative for cough, choking, shortness of breath and wheezing.   Cardiovascular: Negative for chest pain, palpitations and leg swelling.  Gastrointestinal: Negative for abdominal pain, blood in stool, constipation, diarrhea and vomiting.  Endocrine: Negative for cold intolerance, heat intolerance and polydipsia.  Genitourinary: Negative for decreased urine volume, dysuria and hematuria.  Musculoskeletal: Positive for arthralgias and back pain. Negative for gait problem.  Skin: Negative for rash.  Allergic/Immunologic: Negative for environmental allergies.  Neurological: Negative for seizures, syncope, light-headedness and headaches.  Hematological: Negative for adenopathy.  Psychiatric/Behavioral: Negative for agitation, dysphoric mood and suicidal ideas. The patient is not nervous/anxious.     Per HPI unless specifically indicated above     Objective:    BP 117/80 (BP Location: Right Arm, Patient Position: Sitting, Cuff  Size: Large)   Pulse 95   Temp 97.7 F (36.5 C)   Ht 5\' 3"  (1.6 m)   Wt 249 lb (112.9 kg)   LMP 06/16/2008   SpO2 97%   BMI 44.11 kg/m   Wt Readings from Last 3 Encounters:  05/26/17 249 lb (112.9 kg)  03/09/17 260 lb (117.9 kg)  03/03/17 247 lb 12 oz (112.4 kg)    Physical Exam  Constitutional: She is oriented to person, place, and time. She appears well-developed and well-nourished.  HENT:  Head: Normocephalic and atraumatic.  Neck: Neck supple.   Cardiovascular: Normal rate and regular rhythm.  Pulmonary/Chest: Effort normal and breath sounds normal.  Abdominal: Soft. Bowel sounds are normal. She exhibits no mass. There is no hepatosplenomegaly. There is no tenderness.  Musculoskeletal: She exhibits no edema.       Left shoulder: She exhibits decreased range of motion and tenderness. She exhibits no bony tenderness, no swelling, no deformity and normal pulse.       Lumbar back: She exhibits tenderness. She exhibits normal range of motion, no bony tenderness, no swelling, no edema and no deformity.  Lower back with mild diffuse soft tissue tenderness. No point tenderness. No bony tenderness.  Left shoulder with abduction limited to about 80 degrees.  The shoulder his moderate non-point tenderness.  Extension causes discomfort but is not limited.    Lymphadenopathy:    She has no cervical adenopathy.  Neurological: She is alert and oriented to person, place, and time.  Skin: Skin is warm and dry.  Psychiatric: She has a normal mood and affect. Her behavior is normal.  Vitals reviewed.       Assessment & Plan:   Encounter Diagnoses  Name Primary?  . Left shoulder pain, unspecified chronicity Yes  . Acute midline low back pain without sciatica     -suspect back pain may be due to compensation from the L shoulder hurting and still trying to get her work done -Refill diclofenac.  She Can use flexeril when at home (she is reminded to avoid driving on rx due to sedation) -Ice/heat to the area -pt is given Letter that pt needs change of job for several weeks to allow shoulder to heal.  -F/u as scheduled.  RTO sooner prn

## 2017-06-22 ENCOUNTER — Encounter: Payer: Self-pay | Admitting: Physician Assistant

## 2017-06-22 ENCOUNTER — Other Ambulatory Visit (HOSPITAL_COMMUNITY)
Admission: RE | Admit: 2017-06-22 | Discharge: 2017-06-22 | Disposition: A | Discharge status: Home / Self Care | Payer: PRIVATE HEALTH INSURANCE | Source: Ambulatory Visit | Attending: Physician Assistant | Admitting: Physician Assistant

## 2017-06-22 ENCOUNTER — Ambulatory Visit: Payer: Self-pay | Admitting: Physician Assistant

## 2017-06-22 VITALS — BP 126/76 | HR 96 | Temp 98.1°F | Ht 63.0 in | Wt 251.0 lb

## 2017-06-22 DIAGNOSIS — E785 Hyperlipidemia, unspecified: Secondary | ICD-10-CM | POA: Insufficient documentation

## 2017-06-22 DIAGNOSIS — J00 Acute nasopharyngitis [common cold]: Secondary | ICD-10-CM

## 2017-06-22 DIAGNOSIS — Z131 Encounter for screening for diabetes mellitus: Secondary | ICD-10-CM

## 2017-06-22 DIAGNOSIS — R739 Hyperglycemia, unspecified: Secondary | ICD-10-CM | POA: Insufficient documentation

## 2017-06-22 LAB — COMPREHENSIVE METABOLIC PANEL
ALBUMIN: 3.9 g/dL (ref 3.5–5.0)
ALK PHOS: 138 U/L — AB (ref 38–126)
ALT: 34 U/L (ref 14–54)
ANION GAP: 7 (ref 5–15)
AST: 36 U/L (ref 15–41)
BUN: 13 mg/dL (ref 6–20)
CO2: 28 mmol/L (ref 22–32)
Calcium: 8.8 mg/dL — ABNORMAL LOW (ref 8.9–10.3)
Chloride: 103 mmol/L (ref 101–111)
Creatinine, Ser: 0.62 mg/dL (ref 0.44–1.00)
GFR calc Af Amer: >60 mL/min (ref >60)
GFR calc non Af Amer: >60 mL/min (ref >60)
GLUCOSE: 108 mg/dL — AB (ref 65–99)
Potassium: 3.8 mmol/L (ref 3.5–5.1)
SODIUM: 138 mmol/L (ref 135–145)
Total Bilirubin: 0.3 mg/dL (ref 0.3–1.2)
Total Protein: 7.6 g/dL (ref 6.5–8.1)

## 2017-06-22 LAB — HEMOGLOBIN A1C
Hgb A1c MFr Bld: 5.4 % (ref 4.8–5.6)
Mean Plasma Glucose: 108.28 mg/dL

## 2017-06-22 LAB — LIPID PANEL
Cholesterol: 189 mg/dL (ref 0–200)
HDL: 48 mg/dL (ref >40)
LDL CALC: 115 mg/dL — AB (ref 0–99)
TRIGLYCERIDES: 132 mg/dL (ref <150)
Total CHOL/HDL Ratio: 3.9 RATIO
VLDL: 26 mg/dL (ref 0–40)

## 2017-06-22 MED ORDER — SIMVASTATIN 20 MG PO TABS: 20.0000 mg | ORAL_TABLET | Freq: Every day | ORAL | 5 refills | Status: DC

## 2017-06-22 MED ORDER — PROMETHAZINE-DM 6.25-15 MG/5ML PO SYRP: ORAL_SOLUTION | ORAL | 0 refills | Status: DC

## 2017-06-22 NOTE — Patient Instructions (Signed)
Gripe en los adultos °(Influenza, Adult) °La gripe es una infección viral que afecta principalmente las vías respiratorias. Las vías respiratorias incluyen órganos que ayudan a respirar, como los pulmones, la nariz y la garganta. La gripe provoca muchos síntomas del resfrío común, así como fiebre alta y dolor corporal. °Se transmite fácilmente de persona a persona (es contagiosa). La mejor manera de prevenir la gripe es aplicándose la vacuna contra la gripe todos los años. °CAUSAS °La gripe es causada por un virus. Se puede contagiar el virus de las siguientes maneras: °· Al aspirar las gotitas que una persona infectada elimina al toser o estornudar. °· Al tocar algo que fue recientemente contaminado con el virus y luego llevarse la mano a la boca, la nariz o los ojos. °FACTORES DE RIESGO °Los siguientes factores pueden hacer que usted sea propenso a contraer la gripe: °· No lavarse las manos frecuentemente con agua y jabón o un desinfectante de manos a base de alcohol. °· Tener contacto cercano con muchas personas durante la temporada de resfrío y gripe. °· Tocarse la boca, los ojos o la nariz sin lavarse ni desinfectarse las manos primero. °· No beber suficientes líquidos o no seguir una dieta saludable. °· No dormir lo suficiente o no hacer suficiente actividad física. °· Tener un alto grado de estrés. °· No colocarse la vacuna anual contra la gripe. °Puede correr un mayor riesgo de complicaciones de la gripe, como una infección pulmonar grave (neumonía) si: °· Tiene más de 65 años. °· Está embarazada. °· Tiene un sistema que combate las defensas (sistema inmunitario) debilitado. Puede tener un sistema inmunitario debilitado si: °? Tiene VIH o sida. °? Está recibiendo quimioterapia. °? Usa medicamentos que reducen la actividad (suprimen) del sistema inmunitario. °· Tiene una enfermedad a largo plazo (crónica), como cardiopatía coronaria, enfermedad renal, diabetes o enfermedad pulmonar. °· Tiene un trastorno  hepático. °· Son obesas. °· Tiene anemia. °SÍNTOMAS °Los síntomas de esta afección por lo general duran entre 4 y 10 días, y pueden tener los siguientes síntomas: °· Fiebre. °· Escalofríos. °· Dolor de cabeza, dolores en el cuerpo o dolores musculares. °· Dolor de garganta. °· Tos. °· Secreción o congestión nasal. °· Molestias en el pecho y tos. °· Pérdida del apetito. °· Debilidad o cansancio (fatiga). °· Mareos. °· Náuseas o vómitos. °DIAGNÓSTICO °Esta afección se puede diagnosticar en función de los antecedentes médicos y un examen físico. El médico puede indicarle un cultivo faríngeo o nasal para confirmar el diagnóstico. °TRATAMIENTO °Si la gripe se detecta de manera temprana, puede recibir tratamiento con medicamentos antivirales que pueden reducir la duración de la enfermedad y la gravedad de los síntomas. Este medicamento se puede administrar por vía oral o por vía intravenosa (IV), es decir, a través de un tubo que se coloca en una vena. °El objetivo del tratamiento es aliviar los síntomas cuidándose en su casa. Esto puede incluir usar medicamentos de venta libre, beber una gran cantidad de líquidos y agregar humedad al aire en su casa. °En algunos casos, la gripe se cura sin tratamiento. Los casos graves o las complicaciones de gripe se pueden tratar en un hospital. °INSTRUCCIONES PARA EL CUIDADO EN EL HOGAR °· Tome los medicamentos de venta libre y los recetados solamente como se lo haya indicado el médico. °· Use un humidificador de aire frío para agregar humedad al aire de su casa. Esto puede ayudarlo a respirar mejor. °· Descanse todo lo que sea necesario. °· Beba suficiente líquido para mantener la orina clara o de color   amarillo pálido. °· Al toser o estornudar, cúbrase la boca y la nariz. °· Lávese las manos con agua y jabón frecuentemente, en especial después de toser o estornudar. Use desinfectante para manos si no dispone de agua y jabón. °· Quédese en su casa y no concurra al trabajo o a la  escuela, como se lo haya indicado el médico. A menos que visite al médico, evite salir de su casa hasta que no tenga fiebre durante 24 horas sin el uso de medicamentos. °· Concurra a todas las visitas de control como se lo haya indicado el médico. Esto es importante. ° °PREVENCIÓN °· Aplicarse la vacuna anual contra la gripe es la mejor manera de evitar contagiarse la gripe. Puede aplicarse la vacuna contra la gripe a fines de verano, en otoño o en invierno. Pregúntele al médico cuándo debe aplicarse la vacuna contra la gripe. °· Lávese las manos o use un desinfectante de manos con frecuencia. °· Evite el contacto con personas que están enfermas durante la temporada de resfrío y gripe. °· Siga una dieta saludable, beba muchos líquidos, duerma lo suficiente y realice actividad física con regularidad. ° °SOLICITE ATENCIÓN MÉDICA SI: °· Presenta nuevos síntomas. °· Tiene los siguientes síntomas: °? Dolor en el pecho. °? Diarrea. °? Fiebre. °· La tos empeora. °· Produce más mucosidad. °· Siente náuseas o vomita. ° °SOLICITE ATENCIÓN MÉDICA DE INMEDIATO SI: °· Le falta el aire o tiene dificultad para respirar. °· La piel o las uñas se tornan de un color azulado. °· Presenta dolor intenso o rigidez de cuello. °· Le duele la cabeza de forma repentina o tiene dolor en la cara o el oído. °· No puede dejar de vomitar. ° °Esta información no tiene como fin reemplazar el consejo del médico. Asegúrese de hacerle al médico cualquier pregunta que tenga. °Document Released: 03/12/2005 Document Revised: 09/24/2015 Document Reviewed: 03/27/2015 °Elsevier Interactive Patient Education © 2017 [employer] ° °

## 2017-06-22 NOTE — Progress Notes (Signed)
BP 126/76 (BP Location: Left Arm, Patient Position: Sitting, Cuff Size: Large)   Pulse 96   Temp 98.1 F (36.7 C) (Other (Comment))   Ht 5\' 3"  (1.6 m)   Wt 251 lb (113.9 kg)   LMP 06/16/2008   SpO2 95%   BMI 44.46 kg/m    Subjective:    Patient ID: Heidi Gibson, female    DOB: [birth year], 52 y.o.   MRN: [id number]  HPI: Heidi Gibson is a 51 y.o. female presenting on 06/22/2017 for Sore Throat (pt taking tylenol and halls cough drop) and Ear Pain   HPI   Chief Complaint  Patient presents with  . Sore Throat    pt taking tylenol and halls cough drop  . Ear Pain     Pt started feeling bad on Saturday.  She also has HA.   She took some OTC meds last night but nothing today.  Also hoarse voice and a little bit of cough.  The cough is worse at night.   She had feber 101 on satuday.    Relevant past medical, surgical, family and social history reviewed and updated as indicated. Interim medical history since our last visit reviewed. Allergies and medications reviewed and updated.   Current Outpatient Medications:  Marland Kitchen  Misc Natural Products (CALCIUM PLUS ADVANCED PO), Take 1 tablet by mouth daily., Disp: , Rfl:  .  simvastatin (ZOCOR) 20 MG tablet, Take 1 tablet (20 mg total) by mouth daily. Tome una tableta por boca al dormir, Disp: 30 tablet, Rfl: 5   Review of Systems  Constitutional: Positive for chills and fever. Negative for appetite change, diaphoresis, fatigue and unexpected weight change.  HENT: Positive for ear pain, sore throat and trouble swallowing. Negative for congestion, drooling, facial swelling, hearing loss, mouth sores, sneezing and voice change.   Eyes: Negative for pain, discharge, redness, itching and visual disturbance.  Respiratory: Negative for cough, choking, shortness of breath and wheezing.   Cardiovascular: Negative for chest pain, palpitations and leg swelling.  Gastrointestinal: Negative for abdominal pain, blood in  stool, constipation, diarrhea and vomiting.  Endocrine: Negative for cold intolerance, heat intolerance and polydipsia.  Genitourinary: Negative for decreased urine volume, dysuria and hematuria.  Musculoskeletal: Negative for arthralgias, back pain and gait problem.  Skin: Negative for rash.  Allergic/Immunologic: Negative for environmental allergies.  Neurological: Positive for headaches. Negative for seizures, syncope and light-headedness.  Hematological: Negative for adenopathy.  Psychiatric/Behavioral: Negative for agitation, dysphoric mood and suicidal ideas. The patient is not nervous/anxious.     Per HPI unless specifically indicated above     Objective:    BP 126/76 (BP Location: Left Arm, Patient Position: Sitting, Cuff Size: Large)   Pulse 96   Temp 98.1 F (36.7 C) (Other (Comment))   Ht 5\' 3"  (1.6 m)   Wt 251 lb (113.9 kg)   LMP 06/16/2008   SpO2 95%   BMI 44.46 kg/m   Wt Readings from Last 3 Encounters:  06/22/17 251 lb (113.9 kg)  05/26/17 249 lb (112.9 kg)  03/09/17 260 lb (117.9 kg)    Physical Exam  Constitutional: She is oriented to person, place, and time. She appears well-developed and well-nourished.  HENT:  Head: Normocephalic and atraumatic.  Right Ear: Hearing, tympanic membrane, external ear and ear canal normal.  Left Ear: Hearing, tympanic membrane, external ear and ear canal normal.  Nose: Nose normal.  Mouth/Throat: Uvula is midline and oropharynx is clear and moist. No oropharyngeal exudate.  Neck: Neck supple.  Cardiovascular: Normal rate and regular rhythm.  Pulmonary/Chest: Effort normal and breath sounds normal. She has no wheezes.  Abdominal: Soft. Bowel sounds are normal. She exhibits no mass. There is no hepatosplenomegaly. There is no tenderness.  Musculoskeletal: She exhibits no edema.  Lymphadenopathy:    She has no cervical adenopathy.  Neurological: She is alert and oriented to person, place, and time.  Skin: Skin is warm and  dry.  Psychiatric: She has a normal mood and affect. Her behavior is normal.  Vitals reviewed.   Results for orders placed or performed during the hospital encounter of 06/22/17  Lipid panel  Result Value Ref Range   Cholesterol 189 0 - 200 mg/dL   Triglycerides 409132 <811<150 mg/dL   HDL 48 >91>40 mg/dL   Total CHOL/HDL Ratio 3.9 RATIO   VLDL 26 0 - 40 mg/dL   LDL Cholesterol 478115 (H) 0 - 99 mg/dL  Comprehensive metabolic panel  Result Value Ref Range   Sodium 138 135 - 145 mmol/L   Potassium 3.8 3.5 - 5.1 mmol/L   Chloride 103 101 - 111 mmol/L   CO2 28 22 - 32 mmol/L   Glucose, Bld 108 (H) 65 - 99 mg/dL   BUN 13 6 - 20 mg/dL   Creatinine, Ser 2.950.62 0.44 - 1.00 mg/dL   Calcium 8.8 (L) 8.9 - 10.3 mg/dL   Total Protein 7.6 6.5 - 8.1 g/dL   Albumin 3.9 3.5 - 5.0 g/dL   AST 36 15 - 41 U/L   ALT 34 14 - 54 U/L   Alkaline Phosphatase 138 (H) 38 - 126 U/L   Total Bilirubin 0.3 0.3 - 1.2 mg/dL   GFR calc non Af Amer >60 >60 mL/min   GFR calc Af Amer >60 >60 mL/min   Anion gap 7 5 - 15      Assessment & Plan:    Encounter Diagnoses  Name Primary?  . Acute nasopharyngitis Yes  . Hyperlipidemia, unspecified hyperlipidemia type   . Morbid obesity, unspecified obesity type (HCC)     -pt counseled on  Rest. Fluids. OTCs prn. rx phenergan dm for cough.  Pt counseled no driving on this due to sedation -Reviewed labs with pt  -pt to continue her usual meds -pt to Follow up 3 months.  RTO sooner prn

## 2017-07-02 ENCOUNTER — Ambulatory Visit: Payer: Self-pay | Admitting: Physician Assistant

## 2017-09-11 ENCOUNTER — Other Ambulatory Visit (HOSPITAL_COMMUNITY)
Admission: RE | Admit: 2017-09-11 | Discharge: 2017-09-11 | Disposition: A | Discharge status: Home / Self Care | Payer: PRIVATE HEALTH INSURANCE | Source: Ambulatory Visit | Attending: Physician Assistant | Admitting: Physician Assistant

## 2017-09-11 DIAGNOSIS — E785 Hyperlipidemia, unspecified: Secondary | ICD-10-CM | POA: Insufficient documentation

## 2017-09-11 LAB — LIPID PANEL
Cholesterol: 188 mg/dL (ref 0–200)
HDL: 48 mg/dL (ref >40)
LDL CALC: 113 mg/dL — AB (ref 0–99)
Total CHOL/HDL Ratio: 3.9 RATIO
Triglycerides: 137 mg/dL (ref <150)
VLDL: 27 mg/dL (ref 0–40)

## 2017-09-11 LAB — COMPREHENSIVE METABOLIC PANEL
ALT: 21 U/L (ref 14–54)
AST: 24 U/L (ref 15–41)
Albumin: 3.8 g/dL (ref 3.5–5.0)
Alkaline Phosphatase: 118 U/L (ref 38–126)
Anion gap: 11 (ref 5–15)
BUN: 13 mg/dL (ref 6–20)
CHLORIDE: 102 mmol/L (ref 101–111)
CO2: 27 mmol/L (ref 22–32)
Calcium: 8.9 mg/dL (ref 8.9–10.3)
Creatinine, Ser: 0.57 mg/dL (ref 0.44–1.00)
GFR calc non Af Amer: >60 mL/min (ref >60)
Glucose, Bld: 104 mg/dL — ABNORMAL HIGH (ref 65–99)
POTASSIUM: 4.1 mmol/L (ref 3.5–5.1)
SODIUM: 140 mmol/L (ref 135–145)
Total Bilirubin: 0.7 mg/dL (ref 0.3–1.2)
Total Protein: 7.4 g/dL (ref 6.5–8.1)

## 2017-09-21 ENCOUNTER — Ambulatory Visit: Payer: Self-pay | Admitting: Physician Assistant

## 2017-09-21 VITALS — BP 120/78 | HR 93 | Temp 97.9°F | Ht 63.0 in | Wt 247.0 lb

## 2017-09-21 DIAGNOSIS — E785 Hyperlipidemia, unspecified: Secondary | ICD-10-CM

## 2017-09-21 DIAGNOSIS — Z1239 Encounter for other screening for malignant neoplasm of breast: Secondary | ICD-10-CM

## 2017-09-21 DIAGNOSIS — M25512 Pain in left shoulder: Secondary | ICD-10-CM

## 2017-09-21 MED ORDER — SIMVASTATIN 20 MG PO TABS: 20.0000 mg | ORAL_TABLET | Freq: Every day | ORAL | 5 refills | Status: DC

## 2017-09-21 NOTE — Patient Instructions (Signed)
Colesterol  Cholesterol  El colesterol es una sustancia blanca, cerosa, similar a la grasa, que el cuerpo necesita en pequeñas cantidades. El hígado fabrica todo el colesterol que el cuerpo necesita. La sangre transporta el colesterol desde el hígado a través de los vasos sanguíneos. Los depósitos de colesterol (placas) podrían acumularse en las paredes de los vasos sanguíneos (arterias). Las placas provocan el estrechamiento y la rigidez de las arterias. Las placas de colesterol aumentan el riesgo de infarto de miocardio y accidente cerebrovascular.  Aunque sea muy elevado, la concentración de colesterol no puede percibirse. La única forma de saber que tiene colesterol alto es mediante un análisis de sangre. Una vez que se conocen las concentraciones de colesterol, se debe llevar un registro de los resultados de los análisis. Trabaje con el médico para mantener las concentraciones en el rango deseado.  ¿Qué significan los resultados?  · El colesterol total es una medida general de todo el colesterol en sangre.  · El colesterol LDL (lipoproteínas de baja densidad) es el colesterol “malo”. Este tipo es el que hace que se acumulen placas en las paredes de las arterias. Su concentración debe ser baja.  · El colesterol HDL (lipoproteínas de alta densidad) es el colesterol “bueno”, ya que limpia las arterias y arrastra el LDL. Su concentración debe ser alta.  · Los triglicéridos son grasas que el cuerpo puede quemar ya sea como fuente de energía o para almacenar. Las concentraciones altas están estrechamente vinculadas con las enfermedades cardíacas.  ¿Cuáles son las concentraciones de colesterol deseadas?  · El colesterol total debe estar por debajo de 200.  · Para las personas con riesgo, lo aconsejable es mantener el nivel de LDL por debajo de 100; y, para las personas con alto riesgo, es por debajo de 70.  · Se aconseja mantener un nivel de HDL es por encima de 40. Se considera  que un nivel de 60 o superior protege contra las enfermedades cardíacas.  · Los triglicéridos por debajo de 150.  ¿Cómo puedo bajar el colesterol?  Dieta  Siga el programa de alimentación que el médico le indique.  · Elija el pescado o la carne blanca de pollo y pavo, asados u horneados. Limite los cortes grasos de carne roja, los alimentos fritos y las carnes procesadas, como las salchichas y los embutidos.  · Coma gran cantidad de frutas y verduras frescas.  · Elija los cereales integrales, los frijoles, las pastas, las papas y los cereales.  · Elija aceite de oliva, aceite de maíz o aceite de canola, y solo use poca cantidad.  · No coma mantequilla, mayonesa, margarina o aceites de palmiste.  · Evite los alimentos que contengan grasas trans.  · Tome leche descremada o sin grasa y coma yogur y quesos descremados o sin grasa. Evite la leche entera, la crema, los helados, las yemas de huevo y los quesos enteros.  · Los postres sanos incluyen la torta ángel, los bocadillos de jengibre, las galletas con forma de animales, los caramelos duros, los helados de agua y el yogur helado descremado o semidescremado. Evite las masas, tortas, pasteles y galletas.    La práctica de actividad física.  · Siga el programa de ejercicios que le haya indicado el médico. Programa regular:  ? Ayuda a bajar el colesterol LDL y a aumentar el HDL.  ? Ayuda a controlar el peso.  · Haga cosas que aumenten el nivel de actividad; por ejemplo, haga los trabajos de jardinería, salga a caminar o use las   escaleras.  · Pregunte al médico sobre formas de aumentar la actividad en la vida diaria.    Medicamentos  · Tome los medicamentos de venta libre y los recetados solamente como se lo haya indicado el médico.  ? El médico puede recetarle medicamentos para ayudar a bajar el colesterol y reducir el riesgo de enfermedades cardíacas. Por lo general, esto se hace si con la dieta y la actividad física no se logra disminuir el nivel de colesterol.   ? Si tiene varios factores de riesgo, tal vez tenga que tomar medicamentos, incluso si las concentraciones son normales.    Esta información no tiene como fin reemplazar el consejo del médico. Asegúrese de hacerle al médico cualquier pregunta que tenga.  Document Released: 03/12/2005 Document Revised: 09/01/2016 Document Reviewed: 12/01/2015  Elsevier Interactive Patient Education © 2018 [employer]

## 2017-09-21 NOTE — Progress Notes (Signed)
BP 120/78 (BP Location: Right Arm, Patient Position: Sitting, Cuff Size: Large)   Pulse 93   Temp 97.9 F (36.6 C)   Ht 5\' 3"  (1.6 m)   Wt 247 lb (112 kg)   LMP 06/16/2008   SpO2 95%   BMI 43.75 kg/m    Subjective:    Patient ID: Heidi Gibson, female    DOB: 1965/11/17, 52 y.o.   MRN: [id number]  HPI: Heidi Gibson is a 52 y.o. female presenting on 09/21/2017 for Hyperlipidemia   HPI   Pt is doing okay except she is hurting in the LUE all the way down to her left wrist, radiating from her shoulder.  She says she changes jobs every week at work since she took the note.   She doesn't ice after work because it makes it hurt worse.   She was seen for this in December.  She is using IBU  Relevant past medical, surgical, family and social history reviewed and updated as indicated. Interim medical history since our last visit reviewed. Allergies and medications reviewed and updated.   Current Outpatient Medications:  Marland Kitchen  Misc Natural Products (CALCIUM PLUS ADVANCED PO), Take 1 tablet by mouth daily., Disp: , Rfl:  .  simvastatin (ZOCOR) 20 MG tablet, Take 1 tablet (20 mg total) by mouth daily. Tome una tableta por boca al dormir, Disp: 30 tablet, Rfl: 5   Review of Systems  Constitutional: Negative for appetite change, chills, diaphoresis, fatigue, fever and unexpected weight change.  HENT: Negative for congestion, dental problem, drooling, ear pain, facial swelling, hearing loss, mouth sores, sneezing, sore throat, trouble swallowing and voice change.   Eyes: Negative for pain, discharge, redness, itching and visual disturbance.  Respiratory: Negative for cough, choking, shortness of breath and wheezing.   Cardiovascular: Negative for chest pain, palpitations and leg swelling.  Gastrointestinal: Negative for abdominal pain, blood in stool, constipation, diarrhea and vomiting.  Endocrine: Negative for cold intolerance, heat intolerance and polydipsia.   Genitourinary: Negative for decreased urine volume, dysuria and hematuria.  Musculoskeletal: Negative for arthralgias, back pain and gait problem.  Skin: Negative for rash.  Allergic/Immunologic: Negative for environmental allergies.  Neurological: Positive for headaches. Negative for seizures, syncope and light-headedness.  Hematological: Negative for adenopathy.  Psychiatric/Behavioral: Negative for agitation, dysphoric mood and suicidal ideas. The patient is not nervous/anxious.     Per HPI unless specifically indicated above     Objective:    BP 120/78 (BP Location: Right Arm, Patient Position: Sitting, Cuff Size: Large)   Pulse 93   Temp 97.9 F (36.6 C)   Ht 5\' 3"  (1.6 m)   Wt 247 lb (112 kg)   LMP 06/16/2008   SpO2 95%   BMI 43.75 kg/m   Wt Readings from Last 3 Encounters:  09/21/17 247 lb (112 kg)  06/22/17 251 lb (113.9 kg)  05/26/17 249 lb (112.9 kg)    Physical Exam  Constitutional: She is oriented to person, place, and time. She appears well-developed and well-nourished.  HENT:  Head: Normocephalic and atraumatic.  Neck: Neck supple.  Cardiovascular: Normal rate and regular rhythm.  Pulmonary/Chest: Effort normal and breath sounds normal.  Abdominal: Soft. Bowel sounds are normal. She exhibits no mass. There is no hepatosplenomegaly. There is no tenderness.  Musculoskeletal: She exhibits no edema.       Left shoulder: She exhibits decreased range of motion and tenderness. She exhibits no bony tenderness, no swelling, no effusion and normal pulse.  Mildly decreased  ROM.  Mild, non-point tenderness  Lymphadenopathy:    She has no cervical adenopathy.  Neurological: She is alert and oriented to person, place, and time.  Skin: Skin is warm and dry.  Psychiatric: She has a normal mood and affect. Her behavior is normal.  Vitals reviewed.   Results for orders placed or performed during the hospital encounter of 09/11/17  Lipid panel  Result Value Ref Range    Cholesterol 188 0 - 200 mg/dL   Triglycerides 595137 <638<150 mg/dL   HDL 48 >75>40 mg/dL   Total CHOL/HDL Ratio 3.9 RATIO   VLDL 27 0 - 40 mg/dL   LDL Cholesterol 643113 (H) 0 - 99 mg/dL  Comprehensive metabolic panel  Result Value Ref Range   Sodium 140 135 - 145 mmol/L   Potassium 4.1 3.5 - 5.1 mmol/L   Chloride 102 101 - 111 mmol/L   CO2 27 22 - 32 mmol/L   Glucose, Bld 104 (H) 65 - 99 mg/dL   BUN 13 6 - 20 mg/dL   Creatinine, Ser 3.290.57 0.44 - 1.00 mg/dL   Calcium 8.9 8.9 - 51.810.3 mg/dL   Total Protein 7.4 6.5 - 8.1 g/dL   Albumin 3.8 3.5 - 5.0 g/dL   AST 24 15 - 41 U/L   ALT 21 14 - 54 U/L   Alkaline Phosphatase 118 38 - 126 U/L   Total Bilirubin 0.7 0.3 - 1.2 mg/dL   GFR calc non Af Amer >60 >60 mL/min   GFR calc Af Amer >60 >60 mL/min   Anion gap 11 5 - 15      Assessment & Plan:    Encounter Diagnoses  Name Primary?  . Hyperlipidemia, unspecified hyperlipidemia type Yes  . Left shoulder pain, unspecified chronicity   . Screening for breast cancer   . Morbid obesity, unspecified obesity type (HCC)     -reviewed labs with pt -counseled pt on lowfat diet and regular exercise for the lipids.  Will no increase dosage as pt is not diabetic, does not have htn and does not smoke -pt was given [facility] application -will refer for physical therapy for LUE -order screening Mammogram -follow 4 moths.  RTO sooner prn

## 2017-09-22 ENCOUNTER — Other Ambulatory Visit: Payer: Self-pay | Admitting: Physician Assistant

## 2017-09-22 DIAGNOSIS — Z1231 Encounter for screening mammogram for malignant neoplasm of breast: Secondary | ICD-10-CM

## 2017-10-14 ENCOUNTER — Ambulatory Visit: Payer: Self-pay | Admitting: Physician Assistant

## 2017-10-14 ENCOUNTER — Encounter: Payer: Self-pay | Admitting: Physician Assistant

## 2017-10-14 VITALS — BP 120/76 | HR 96 | Temp 97.9°F | Ht 63.0 in | Wt 245.8 lb

## 2017-10-14 DIAGNOSIS — T148XXA Other injury of unspecified body region, initial encounter: Secondary | ICD-10-CM

## 2017-10-14 DIAGNOSIS — S61209A Unspecified open wound of unspecified finger without damage to nail, initial encounter: Secondary | ICD-10-CM

## 2017-10-14 DIAGNOSIS — L089 Local infection of the skin and subcutaneous tissue, unspecified: Secondary | ICD-10-CM

## 2017-10-14 DIAGNOSIS — S81801A Unspecified open wound, right lower leg, initial encounter: Secondary | ICD-10-CM

## 2017-10-14 MED ORDER — CEPHALEXIN 500 MG PO CAPS: ORAL_CAPSULE | ORAL | 0 refills | Status: DC

## 2017-10-14 NOTE — Progress Notes (Signed)
BP 120/76 (BP Location: Left Arm, Patient Position: Sitting, Cuff Size: Normal)   Pulse 96   Temp 97.9 F (36.6 C)   Ht  (1.6 m)   Wt 245 lb 12 oz (111.5 kg)   LMP 06/16/2008   SpO2 97%   BMI 43.53 kg/m    Subjective:    Patient ID: Heidi Gibson, female    DOB: 10-31-1965, 52 y.o.   MRN: [id number]  HPI: Heidi Gibson is a 52 y.o. female presenting on 10/14/2017 for Laceration (on 4th finger on L hand. pt states she has been applying ) and Insect Bite (on L leg. pt states she thinks it was a bug bite. pt states it itches, and feels hot. pt thinks she had a fever on Monday.)   HPI  Chief Complaint  Patient presents with  . Laceration    on 4th finger on L hand. pt states she has been applying   . Insect Bite    on L leg. pt states she thinks it was a bug bite. pt states it itches, and feels hot. pt thinks she had a fever on Monday.     She cut finger last week.  She has been using H2O2 on it several times daily.   She has a wound place on her leg since last week.  She is using heated alcohol on it.  She doesn't know how she got the wound but guesses it must have been from an insect because she can conceive of no other reasonable alternative.   Relevant past medical, surgical, family and social history reviewed and updated as indicated. Interim medical history since our last visit reviewed. Allergies and medications reviewed and updated.  Review of Systems  Constitutional: Negative for appetite change, chills, diaphoresis, fatigue, fever and unexpected weight change.  HENT: Negative for congestion, dental problem, drooling, ear pain, facial swelling, hearing loss, mouth sores, sneezing, sore throat, trouble swallowing and voice change.   Eyes: Negative for pain, discharge, redness, itching and visual disturbance.  Respiratory: Negative for cough, choking, shortness of breath and wheezing.   Cardiovascular: Negative for chest pain,  palpitations and leg swelling.  Gastrointestinal: Negative for abdominal pain, blood in stool, constipation, diarrhea and vomiting.  Endocrine: Negative for cold intolerance, heat intolerance and polydipsia.  Genitourinary: Negative for decreased urine volume, dysuria and hematuria.  Musculoskeletal: Negative for arthralgias, back pain and gait problem.  Skin: Negative for rash.  Allergic/Immunologic: Negative for environmental allergies.  Neurological: Negative for seizures, syncope, light-headedness and headaches.  Hematological: Negative for adenopathy.  Psychiatric/Behavioral: Negative for agitation, dysphoric mood and suicidal ideas. The patient is not nervous/anxious.     Per HPI unless specifically indicated above     Objective:    BP 120/76 (BP Location: Left Arm, Patient Position: Sitting, Cuff Size: Normal)   Pulse 96   Temp 97.9 F (36.6 C)   Ht  (1.6 m)   Wt 245 lb 12 oz (111.5 kg)   LMP 06/16/2008   SpO2 97%   BMI 43.53 kg/m   Wt Readings from Last 3 Encounters:  10/14/17 245 lb 12 oz (111.5 kg)  09/21/17 247 lb (112 kg)  06/22/17 251 lb (113.9 kg)    Physical Exam  Constitutional: She is oriented to person, place, and time. She appears well-developed and well-nourished.  HENT:  Head: Normocephalic and atraumatic.  Pulmonary/Chest: Effort normal. No respiratory distress.  Musculoskeletal:       Left  hand: She exhibits normal range of motion and no tenderness.       Right lower leg: She exhibits tenderness and swelling. She exhibits no bony tenderness.  L 4th finger with small skin defect where appears wound is trying to heal.  Mildly inflamed/puffy, no redness, non-tender.  All joints in finger move normally.  Normal sensation  Open wound R shin approx 1 cm diameter.  There is surrounding puffiness and tenderness.  The wound is angry and mildly red. No pus draining. The shin/calf is without redness.  Neurological: She is alert and oriented to person,  place, and time.  Skin: Skin is warm and dry.  Psychiatric: She has a normal mood and affect. Her behavior is normal.  Nursing note and vitals reviewed.       Assessment & Plan:   Encounter Diagnoses  Name Primary?  . Open wound of finger, initial encounter Yes  . Wound of right lower extremity, initial encounter   . Infected wound    -pt counseled to stop using H2O2 and the heated alcohol (or any other type of alcohol) to her wounds.  Explained that these are both very harsh and are likely preventing proper healing.  Pt is to apply dry bandaid if desired.  Otherwise clean once daily with regular soap and water.  rx keflex for the shin wound which appears infected. -pt to RTO if either wound worsens or if they fail to heal.  Otherwise pt to follow up as scheduled

## 2017-12-29 ENCOUNTER — Encounter: Payer: Self-pay | Admitting: Physician Assistant

## 2017-12-29 ENCOUNTER — Ambulatory Visit: Payer: Self-pay | Admitting: Physician Assistant

## 2017-12-29 VITALS — BP 116/80 | HR 104 | Temp 97.9°F | Ht 63.0 in | Wt 243.5 lb

## 2017-12-29 DIAGNOSIS — L02211 Cutaneous abscess of abdominal wall: Secondary | ICD-10-CM

## 2017-12-29 DIAGNOSIS — J00 Acute nasopharyngitis [common cold]: Secondary | ICD-10-CM

## 2017-12-29 MED ORDER — BENZONATATE 100 MG PO CAPS: ORAL_CAPSULE | ORAL | 3 refills | Status: DC

## 2017-12-29 MED ORDER — SULFAMETHOXAZOLE-TRIMETHOPRIM 800-160 MG PO TABS
ORAL_TABLET | ORAL | 0 refills | Status: DC
Start: 2017-12-29 — End: 2018-01-21

## 2017-12-29 NOTE — Progress Notes (Signed)
BP 116/80 (BP Location: Left Arm, Patient Position: Sitting, Cuff Size: Large)   Pulse (!) 104   Temp 97.9 F (36.6 C)   Ht 5\' 3"  (1.6 m)   Wt 243 lb 8 oz (110.5 kg)   LMP 06/16/2008   SpO2 95%   BMI 43.13 kg/m    Subjective:    Patient ID: Byrd Hesselbach del Lechelle Wrigley, female    DOB: 21-Sep-1965, 52 y.o.   MRN: [id number]  HPI: Sashay del Melanni Benway is a 52 y.o. female presenting on 12/29/2017 for Cough (cough began on Friday. sore throat, chest pain, cough, phlegm, some ear pain yesterday, but not today, chills, HA, body aches, sub fever on saturday, some wheezing, and hoarseness. pt states she has taken tylenol which was helpful. pt has also taken Children's Dimetapp syrup which helped a little) and Mass (on abd. pt states it started off small last week and has gotten larger. pt states it is painful and feels the area is hot. pt states she applied vaporub on it last night because it was painful. pt states it was a little helpful)   HPI  Chief Complaint  Patient presents with  . Cough    cough began on Friday. sore throat, chest pain, cough, phlegm, some ear pain yesterday, but not today, chills, HA, body aches, sub fever on saturday, some wheezing, and hoarseness. pt states she has taken tylenol which was helpful. pt has also taken Children's Dimetapp syrup which helped a little  . Mass    on abd. pt states it started off small last week and has gotten larger. pt states it is painful and feels the area is hot. pt states she applied vaporub on it last night because it was painful. pt states it was a little helpful    Relevant past medical, surgical, family and social history reviewed and updated as indicated. Interim medical history since our last visit reviewed. Allergies and medications reviewed and updated.  Review of Systems  Constitutional: Positive for chills. Negative for appetite change, diaphoresis, fatigue, fever and unexpected weight change.  HENT: Positive  for sore throat. Negative for congestion, drooling, ear pain, facial swelling, hearing loss, mouth sores, sneezing, trouble swallowing and voice change.   Eyes: Negative for pain, discharge, redness, itching and visual disturbance.  Respiratory: Positive for cough. Negative for choking, shortness of breath and wheezing.   Cardiovascular: Positive for chest pain (when coughing). Negative for palpitations and leg swelling.  Gastrointestinal: Negative for abdominal pain, blood in stool, constipation, diarrhea and vomiting.  Endocrine: Negative for cold intolerance, heat intolerance and polydipsia.  Genitourinary: Negative for decreased urine volume, dysuria and hematuria.  Musculoskeletal: Negative for arthralgias, back pain and gait problem.  Skin: Negative for rash.  Allergic/Immunologic: Negative for environmental allergies.  Neurological: Positive for headaches. Negative for seizures, syncope and light-headedness.  Hematological: Negative for adenopathy.  Psychiatric/Behavioral: Negative for agitation, dysphoric mood and suicidal ideas. The patient is not nervous/anxious.     Per HPI unless specifically indicated above     Objective:    BP 116/80 (BP Location: Left Arm, Patient Position: Sitting, Cuff Size: Large)   Pulse (!) 104   Temp 97.9 F (36.6 C)   Ht 5\' 3"  (1.6 m)   Wt 243 lb 8 oz (110.5 kg)   LMP 06/16/2008   SpO2 95%   BMI 43.13 kg/m   Wt Readings from Last 3 Encounters:  12/29/17 243 lb 8 oz (110.5 kg)  10/14/17 245 lb  12 oz (111.5 kg)  09/21/17 247 lb (112 kg)    Physical Exam  Constitutional: She is oriented to person, place, and time. She appears well-developed and well-nourished.  HENT:  Head: Normocephalic and atraumatic.  Right Ear: Hearing, tympanic membrane, external ear and ear canal normal.  Left Ear: Hearing, tympanic membrane, external ear and ear canal normal.  Nose: Rhinorrhea present.  Mouth/Throat: Uvula is midline and oropharynx is clear and  moist. No oropharyngeal exudate.  Neck: Neck supple.  Cardiovascular: Normal rate and regular rhythm.  Pulmonary/Chest: Effort normal and breath sounds normal. She has no wheezes.  Lymphadenopathy:    She has no cervical adenopathy.  Neurological: She is alert and oriented to person, place, and time.  Skin: Skin is warm and dry.     Abscess just over 1 cm diameter LLQ on lower portion of adipose roll .  Area is very tender.  Localized fluctuance.  Surrounding tissue not red.   Psychiatric: She has a normal mood and affect. Her behavior is normal.  Nursing note and vitals reviewed.   Explained I&D and answered questions and pt signed informed consent.   Cleaned area with hibiclens.  Instilled lidocaine1%. Opened with # 11 blade. Copious pus expressed.  Broke up loculations.  Wound packed with 1/4 in packing guaze.  Dry sterile dressing applied.    Pt tolerated well. No bleeding.       Assessment & Plan:    Encounter Diagnoses  Name Primary?  . Cutaneous abscess of abdominal wall Yes  . Acute nasopharyngitis     -pt counseled to remove packing tomorrow morning.  Rx septra ds -pt given tessalon for the cough and counseled on URI.  Recommend rest, fluids, tyelenol as needed -pt to follow up next month for routine appointment as scheduled.  She is to RTO sooner if her abscess fails to resolve or if she worsens or gets new symtpoms

## 2018-01-20 ENCOUNTER — Other Ambulatory Visit (HOSPITAL_COMMUNITY)
Admission: RE | Admit: 2018-01-20 | Discharge: 2018-01-20 | Disposition: A | Discharge status: Home / Self Care | Payer: Self-pay | Source: Ambulatory Visit | Attending: Physician Assistant | Admitting: Physician Assistant

## 2018-01-20 DIAGNOSIS — E785 Hyperlipidemia, unspecified: Secondary | ICD-10-CM | POA: Insufficient documentation

## 2018-01-20 LAB — COMPREHENSIVE METABOLIC PANEL
ALT: 17 U/L (ref 0–44)
ANION GAP: 8 (ref 5–15)
AST: 21 U/L (ref 15–41)
Albumin: 3.9 g/dL (ref 3.5–5.0)
Alkaline Phosphatase: 112 U/L (ref 38–126)
BILIRUBIN TOTAL: 0.5 mg/dL (ref 0.3–1.2)
BUN: 15 mg/dL (ref 6–20)
CO2: 28 mmol/L (ref 22–32)
Calcium: 8.9 mg/dL (ref 8.9–10.3)
Chloride: 104 mmol/L (ref 98–111)
Creatinine, Ser: 0.64 mg/dL (ref 0.44–1.00)
GFR calc Af Amer: >60 mL/min (ref >60)
GFR calc non Af Amer: >60 mL/min (ref >60)
GLUCOSE: 105 mg/dL — AB (ref 70–99)
POTASSIUM: 4 mmol/L (ref 3.5–5.1)
SODIUM: 140 mmol/L (ref 135–145)
TOTAL PROTEIN: 7.5 g/dL (ref 6.5–8.1)

## 2018-01-20 LAB — LIPID PANEL
CHOL/HDL RATIO: 3.9 ratio
CHOLESTEROL: 174 mg/dL (ref 0–200)
HDL: 45 mg/dL (ref >40)
LDL CALC: 98 mg/dL (ref 0–99)
Triglycerides: 156 mg/dL — ABNORMAL HIGH (ref <150)
VLDL: 31 mg/dL (ref 0–40)

## 2018-01-21 ENCOUNTER — Ambulatory Visit: Payer: Self-pay | Admitting: Physician Assistant

## 2018-01-21 ENCOUNTER — Other Ambulatory Visit: Payer: Self-pay | Admitting: Physician Assistant

## 2018-01-21 ENCOUNTER — Encounter: Payer: Self-pay | Admitting: Physician Assistant

## 2018-01-21 VITALS — BP 108/70 | HR 86 | Temp 97.9°F | Ht 63.0 in | Wt 243.8 lb

## 2018-01-21 DIAGNOSIS — E785 Hyperlipidemia, unspecified: Secondary | ICD-10-CM

## 2018-01-21 DIAGNOSIS — Z1211 Encounter for screening for malignant neoplasm of colon: Secondary | ICD-10-CM

## 2018-01-21 DIAGNOSIS — Z1239 Encounter for other screening for malignant neoplasm of breast: Secondary | ICD-10-CM

## 2018-01-21 MED ORDER — SIMVASTATIN 20 MG PO TABS: 20.0000 mg | ORAL_TABLET | Freq: Every day | ORAL | 5 refills | Status: DC

## 2018-01-21 NOTE — Progress Notes (Signed)
BP 108/70 (BP Location: Left Arm, Patient Position: Sitting, Cuff Size: Large)   Pulse 86   Temp 97.9 F (36.6 C)   Ht 5\' 3"  (1.6 m)   Wt 243 lb 12 oz (110.6 kg)   LMP 06/16/2008   SpO2 93%   BMI 43.18 kg/m    Subjective:    Patient ID: Heidi Gibson, female    DOB: [birth year], 52 y.o.   MRN: [id number]  HPI: Heidi Gibson is a 52 y.o. female presenting on 01/21/2018 for Hyperlipidemia   HPI   Pt is doing well and has no complaints today  Relevant past medical, surgical, family and social history reviewed and updated as indicated. Interim medical history since our last visit reviewed. Allergies and medications reviewed and updated.   Current Outpatient Medications:  .  simvastatin (ZOCOR) 20 MG tablet, Take 1 tablet (20 mg total) by mouth daily. Tome una tableta por boca al dormir, Disp: 30 tablet, Rfl: 5   Review of Systems  Constitutional: Negative for appetite change, chills, diaphoresis, fatigue, fever and unexpected weight change.  HENT: Negative for congestion, dental problem, drooling, ear pain, facial swelling, hearing loss, mouth sores, sneezing, sore throat, trouble swallowing and voice change.   Eyes: Negative for pain, discharge, redness, itching and visual disturbance.  Respiratory: Negative for cough, choking, shortness of breath and wheezing.   Cardiovascular: Negative for chest pain, palpitations and leg swelling.  Gastrointestinal: Negative for abdominal pain, blood in stool, constipation, diarrhea and vomiting.  Endocrine: Negative for cold intolerance, heat intolerance and polydipsia.  Genitourinary: Negative for decreased urine volume, dysuria and hematuria.  Musculoskeletal: Negative for arthralgias, back pain and gait problem.  Skin: Negative for rash.  Allergic/Immunologic: Negative for environmental allergies.  Neurological: Negative for seizures, syncope, light-headedness and headaches.  Hematological: Negative for  adenopathy.  Psychiatric/Behavioral: Negative for agitation, dysphoric mood and suicidal ideas. The patient is not nervous/anxious.     Per HPI unless specifically indicated above     Objective:    BP 108/70 (BP Location: Left Arm, Patient Position: Sitting, Cuff Size: Large)   Pulse 86   Temp 97.9 F (36.6 C)   Ht 5\' 3"  (1.6 m)   Wt 243 lb 12 oz (110.6 kg)   LMP 06/16/2008   SpO2 93%   BMI 43.18 kg/m   Wt Readings from Last 3 Encounters:  01/21/18 243 lb 12 oz (110.6 kg)  12/29/17 243 lb 8 oz (110.5 kg)  10/14/17 245 lb 12 oz (111.5 kg)    Physical Exam  Constitutional: She is oriented to person, place, and time. She appears well-developed and well-nourished.  HENT:  Head: Normocephalic and atraumatic.  Neck: Neck supple.  Cardiovascular: Normal rate and regular rhythm.  Pulmonary/Chest: Effort normal and breath sounds normal.  Abdominal: Soft. Bowel sounds are normal. She exhibits no mass. There is no hepatosplenomegaly. There is no tenderness.  Musculoskeletal: She exhibits no edema.  Lymphadenopathy:    She has no cervical adenopathy.  Neurological: She is alert and oriented to person, place, and time.  Skin: Skin is warm and dry.  Psychiatric: She has a normal mood and affect. Her behavior is normal.  Vitals reviewed.   Results for orders placed or performed during the hospital encounter of 01/20/18  Lipid panel  Result Value Ref Range   Cholesterol 174 0 - 200 mg/dL   Triglycerides 440 (H) <150 mg/dL   HDL 45 >10 mg/dL   Total CHOL/HDL Ratio 3.9  RATIO   VLDL 31 0 - 40 mg/dL   LDL Cholesterol 98 0 - 99 mg/dL  Comprehensive metabolic panel  Result Value Ref Range   Sodium 140 135 - 145 mmol/L   Potassium 4.0 3.5 - 5.1 mmol/L   Chloride 104 98 - 111 mmol/L   CO2 28 22 - 32 mmol/L   Glucose, Bld 105 (H) 70 - 99 mg/dL   BUN 15 6 - 20 mg/dL   Creatinine, Ser 5.780.64 0.44 - 1.00 mg/dL   Calcium 8.9 8.9 - 46.910.3 mg/dL   Total Protein 7.5 6.5 - 8.1 g/dL   Albumin  3.9 3.5 - 5.0 g/dL   AST 21 15 - 41 U/L   ALT 17 0 - 44 U/L   Alkaline Phosphatase 112 38 - 126 U/L   Total Bilirubin 0.5 0.3 - 1.2 mg/dL   GFR calc non Af Amer >60 >60 mL/min   GFR calc Af Amer >60 >60 mL/min   Anion gap 8 5 - 15      Assessment & Plan:    Encounter Diagnoses  Name Primary?  . Hyperlipidemia, unspecified hyperlipidemia type Yes  . Screening for colon cancer   . Morbid obesity, unspecified obesity type (HCC)   . Screening for breast cancer      -Reviewed labs with pt -pt to continue current rx -pt given iFOBT for colon cancer screening -pt is given information to call [facility] for screening mammogram -Pt to follow up 6 months.  RTO sooner prn

## 2018-01-21 NOTE — Patient Instructions (Signed)
[facility] [phone]

## 2018-03-02 ENCOUNTER — Ambulatory Visit: Payer: Self-pay | Admitting: Physician Assistant

## 2018-03-02 ENCOUNTER — Encounter: Payer: Self-pay | Admitting: Physician Assistant

## 2018-03-02 VITALS — BP 108/68 | HR 88 | Temp 97.7°F | Ht 63.0 in | Wt 246.0 lb

## 2018-03-02 DIAGNOSIS — T300 Burn of unspecified body region, unspecified degree: Secondary | ICD-10-CM

## 2018-03-02 NOTE — Patient Instructions (Signed)
Bacitracin ointment
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
## 2018-03-16 ENCOUNTER — Other Ambulatory Visit (HOSPITAL_COMMUNITY): Payer: Self-pay | Admitting: *Deleted

## 2018-03-16 DIAGNOSIS — Z1231 Encounter for screening mammogram for malignant neoplasm of breast: Secondary | ICD-10-CM

## 2018-03-24 ENCOUNTER — Encounter (HOSPITAL_COMMUNITY): Payer: Self-pay

## 2018-03-24 ENCOUNTER — Ambulatory Visit (HOSPITAL_COMMUNITY)
Admission: RE | Admit: 2018-03-24 | Discharge: 2018-03-24 | Disposition: A | Discharge status: Home / Self Care | Payer: PRIVATE HEALTH INSURANCE | Source: Ambulatory Visit | Attending: *Deleted | Admitting: *Deleted

## 2018-03-24 DIAGNOSIS — Z1231 Encounter for screening mammogram for malignant neoplasm of breast: Secondary | ICD-10-CM | POA: Diagnosis present

## 2018-03-29 ENCOUNTER — Ambulatory Visit: Payer: Self-pay | Admitting: Physician Assistant

## 2018-03-30 ENCOUNTER — Ambulatory Visit: Payer: Self-pay | Admitting: Physician Assistant

## 2018-03-30 ENCOUNTER — Encounter: Payer: Self-pay | Admitting: Physician Assistant

## 2018-03-30 VITALS — BP 121/74 | HR 91 | Temp 97.7°F

## 2018-03-30 DIAGNOSIS — J029 Acute pharyngitis, unspecified: Secondary | ICD-10-CM

## 2018-03-30 DIAGNOSIS — R21 Rash and other nonspecific skin eruption: Secondary | ICD-10-CM

## 2018-03-30 MED ORDER — TRIAMCINOLONE ACETONIDE 0.1 % EX CREA: TOPICAL_CREAM | CUTANEOUS | 1 refills | Status: DC

## 2018-03-30 NOTE — Progress Notes (Signed)
BP 121/74   Pulse 91   Temp 97.7 F (36.5 C)   LMP 06/16/2008   SpO2 99%    Subjective:    Patient ID: Heidi Gibson, female    DOB: April 19, 1966, 52 y.o.   MRN: [id number]  HPI: Heidi Gibson is a 52 y.o. female presenting on 03/30/2018 for Rash   HPI Rash started last Tuesday.  It itches.  She is putting some lotion on it without improvement    There is no rash elsewhere on body  She also complains of sore throat that started yesterday.  No earache or cough.  Mild nasal congestion.  No fever  Relevant past medical, surgical, family and social history reviewed and updated as indicated. Interim medical history since our last visit reviewed. Allergies and medications reviewed and updated.   Current Outpatient Medications:  .  simvastatin (ZOCOR) 20 MG tablet, Take 1 tablet (20 mg total) by mouth daily. Tome una tableta por boca al dormir, Disp: 30 tablet, Rfl: 5 .  triamcinolone cream (KENALOG) 0.1 %, Apply to affected area bid as needed.   aplicar al area Motorola al dia segun sea necesario, Disp: 15 g, Rfl: 1   Review of Systems  Constitutional: Negative for appetite change, chills, diaphoresis, fatigue, fever and unexpected weight change.  HENT: Positive for sore throat. Negative for congestion, dental problem, drooling, ear pain, facial swelling, hearing loss, mouth sores, sneezing, trouble swallowing and voice change.   Eyes: Negative for pain, discharge, redness, itching and visual disturbance.  Respiratory: Negative for cough, choking, shortness of breath and wheezing.   Cardiovascular: Negative for chest pain, palpitations and leg swelling.  Gastrointestinal: Negative for abdominal pain, blood in stool, constipation, diarrhea and vomiting.  Endocrine: Negative for cold intolerance, heat intolerance and polydipsia.  Genitourinary: Negative for decreased urine volume, dysuria and hematuria.  Musculoskeletal: Negative for  arthralgias, back pain and gait problem.  Skin: Negative for rash.  Allergic/Immunologic: Negative for environmental allergies.  Neurological: Negative for seizures, syncope, light-headedness and headaches.  Hematological: Negative for adenopathy.  Psychiatric/Behavioral: Negative for agitation, dysphoric mood and suicidal ideas. The patient is not nervous/anxious.     Per HPI unless specifically indicated above     Objective:    BP 121/74   Pulse 91   Temp 97.7 F (36.5 C)   LMP 06/16/2008   SpO2 99%   Wt Readings from Last 3 Encounters:  03/02/18 246 lb (111.6 kg)  01/21/18 243 lb 12 oz (110.6 kg)  12/29/17 243 lb 8 oz (110.5 kg)    Physical Exam  Constitutional: She is oriented to person, place, and time. She appears well-developed and well-nourished.  HENT:  Head: Normocephalic and atraumatic.  Right Ear: Hearing, tympanic membrane, external ear and ear canal normal.  Left Ear: Hearing, tympanic membrane, external ear and ear canal normal.  Nose: Nose normal.  Mouth/Throat: Uvula is midline and oropharynx is clear and moist. No oropharyngeal exudate.  Neck: Neck supple.  Cardiovascular: Normal rate and regular rhythm.  Pulmonary/Chest: Effort normal and breath sounds normal. She has no wheezes.  Lymphadenopathy:    She has no cervical adenopathy.  Neurological: She is alert and oriented to person, place, and time.  Skin: Skin is warm and dry.  2 inch round patch on R lower leg with small vesicles on red base.  No rash seen elsewhere.    Psychiatric: She has a normal mood and affect. Her behavior is normal.  Vitals reviewed.       Assessment & Plan:   Encounter Diagnoses  Name Primary?  . Rash Yes  . Sore throat     -Warm salt water gargles and APAP or IBU prn sore throat -rx TAC for rash -pt to follow up as scheduled.  RTO sooner prn

## 2018-07-06 ENCOUNTER — Encounter: Payer: Self-pay | Admitting: Physician Assistant

## 2018-07-06 ENCOUNTER — Ambulatory Visit: Payer: Self-pay | Admitting: Physician Assistant

## 2018-07-06 VITALS — BP 110/70 | HR 116 | Temp 99.0°F | Wt 232.5 lb

## 2018-07-06 DIAGNOSIS — J111 Influenza due to unidentified influenza virus with other respiratory manifestations: Secondary | ICD-10-CM

## 2018-07-06 DIAGNOSIS — R69 Illness, unspecified: Principal | ICD-10-CM

## 2018-07-06 MED ORDER — BENZONATATE 100 MG PO CAPS: ORAL_CAPSULE | ORAL | 3 refills | Status: DC

## 2018-07-06 MED ORDER — OSELTAMIVIR PHOSPHATE 75 MG PO CAPS: ORAL_CAPSULE | ORAL | 0 refills | Status: DC

## 2018-07-06 NOTE — Progress Notes (Signed)
BP 110/70   Pulse (!) 124   Temp 97.9 F (36.6 C)   Wt 232 lb 8 oz (105.5 kg)   LMP 06/16/2008   SpO2 96%   BMI 41.19 kg/m    Subjective:    Patient ID: Heidi Gibson, female    DOB: 1965-12-07, 53 y.o.   MRN: [id number]  HPI: Heidi Gibson is a 53 y.o. female presenting on 07/06/2018 for Cough (dry cough that makes her chest hurt, fever, ear pain, sore throat, HA, chills, soreness. pt has taken tylenol which has helped. sx began yesterday 07-05-2018)   HPI   Chief Complaint  Patient presents with  . Cough    dry cough that makes her chest hurt, fever, ear pain, sore throat, HA, chills, soreness. pt has taken tylenol which has helped. sx began yesterday 07-05-2018   She started feeling bad Sunday night.  She had fever yesterday over 100.   She c/o body aches all over.  Relevant past medical, surgical, family and social history reviewed and updated as indicated. Interim medical history since our last visit reviewed. Allergies and medications reviewed and updated.   Current Outpatient Medications:  .  simvastatin (ZOCOR) 20 MG tablet, Take 1 tablet (20 mg total) by mouth daily. Tome una tableta por boca al dormir, Disp: 30 tablet, Rfl: 5 .  triamcinolone cream (KENALOG) 0.1 %, Apply to affected area bid as needed.   aplicar al area Motorolaafectada dos veces al dia segun sea necesario (Patient not taking: Reported on 07/06/2018), Disp: 15 g, Rfl: 1   Review of Systems  Constitutional: Positive for fever. Negative for appetite change, chills, diaphoresis, fatigue and unexpected weight change.  HENT: Positive for ear pain and sore throat. Negative for congestion, drooling, facial swelling, hearing loss, mouth sores, sneezing, trouble swallowing and voice change.   Eyes: Negative for pain, discharge, redness, itching and visual disturbance.  Respiratory: Positive for cough. Negative for choking, shortness of breath and wheezing.   Cardiovascular: Positive  for chest pain. Negative for palpitations and leg swelling.  Gastrointestinal: Negative for abdominal pain, blood in stool, constipation, diarrhea and vomiting.  Endocrine: Negative for cold intolerance, heat intolerance and polydipsia.  Genitourinary: Negative for decreased urine volume, dysuria and hematuria.  Musculoskeletal: Negative for arthralgias, back pain and gait problem.  Skin: Negative for rash.  Allergic/Immunologic: Negative for environmental allergies.  Neurological: Positive for headaches. Negative for seizures, syncope and light-headedness.  Hematological: Negative for adenopathy.  Psychiatric/Behavioral: Negative for agitation, dysphoric mood and suicidal ideas. The patient is not nervous/anxious.     Per HPI unless specifically indicated above     Objective:    BP 110/70   Pulse (!) 124   Temp 97.9 F (36.6 C)   Wt 232 lb 8 oz (105.5 kg)   LMP 06/16/2008   SpO2 96%   BMI 41.19 kg/m   Wt Readings from Last 3 Encounters:  07/06/18 232 lb 8 oz (105.5 kg)  03/02/18 246 lb (111.6 kg)  01/21/18 243 lb 12 oz (110.6 kg)    Physical Exam Vitals signs and nursing note reviewed.  Constitutional:      Appearance: She is well-developed.     Comments: Looks sick  HENT:     Head: Normocephalic and atraumatic.     Right Ear: Hearing, tympanic membrane, ear canal and external ear normal.     Left Ear: Hearing, tympanic membrane, ear canal and external ear normal.     Nose:  Nose normal.     Mouth/Throat:     Pharynx: Uvula midline. No oropharyngeal exudate.  Neck:     Musculoskeletal: Neck supple.  Cardiovascular:     Rate and Rhythm: Normal rate and regular rhythm.  Pulmonary:     Effort: Pulmonary effort is normal. No respiratory distress.     Breath sounds: Normal breath sounds. No wheezing.  Lymphadenopathy:     Cervical: No cervical adenopathy.  Skin:    General: Skin is warm and dry.  Neurological:     Mental Status: She is alert and oriented to person,  place, and time.  Psychiatric:        Behavior: Behavior normal. Behavior is cooperative.         Assessment & Plan:    Encounter Diagnosis  Name Primary?  . Influenza-like illness Yes     -counseled pt on possible benefits of tamiflu and she elected to take it.  She was given coupon to go with it -rest. Fluids. APAP or IBU prn.  -gave note to be out of work remainder of this week/RTW on 07/12/18 -pt to follow up here as scheduled.  RTO sooner prn worsening or new symptoms

## 2018-07-06 NOTE — Patient Instructions (Signed)
Oseltamivir capsules Qu es este medicamento? El OSELTAMIVIR es un medicamento antiviral. Se utiliza para prevenir y tratar algunos tipos de gripe. No es efectivo para resfros u otras infecciones de origen viral. Este medicamento puede ser utilizado para otros usos; si tiene alguna pregunta consulte con su proveedor de atencin mdica o con su farmacutico. MARCAS COMUNES: Tamiflu Qu le debo informar a mi profesional de la salud antes de tomar este medicamento? Necesitan saber si usted presenta alguno de los siguientes problemas o situaciones: enfermedad cardiaca problemas del sistema inmunolgico enfermedad renal enfermedad heptica enfermedad pulmonar una reaccin alrgica o inusual al oseltamivir, a otros medicamentos, alimentos, colorantes o conservantes si est embarazada o buscando quedar embarazada si est amamantando a un beb Cmo debo utilizar este medicamento? Tome este medicamento por va oral con un vaso de agua. Siga las instrucciones de la etiqueta del [place]. Comience a usar [employer] ante los primeros sntomas de gripe. Puede tomarlo con o sin alimentos. Si el Social worker, tmelo con alimentos. Tome su medicamento a intervalos regulares. No tome su medicamento con una frecuencia mayor a la indicada. Complete todas las dosis de su medicamento como se le haya indicado, aun si se siente mejor. No omita ninguna dosis ni suspenda el uso de su medicamento antes de lo indicado. Hable con su pediatra para informarse acerca del uso de este medicamento en nios. Aunque este medicamento se puede recetar a nios tan pequeos como de [address] de edad con ciertas afecciones, existen precauciones que deben tomarse. Sobredosis: Pngase en contacto inmediatamente con un centro toxicolgico o una sala de urgencia si usted cree que haya tomado demasiado medicamento. ATENCIN: [employer] es solo para usted. No comparta este medicamento con nadie. Qu  sucede si me olvido de una dosis? Si olvida una dosis, tmela en cuanto se acuerde. Si es la hora de su prxima dosis (si faltan menos de 2 horas), tome slo esa dosis. No tome dosis adicionales o dobles. Qu puede interactuar con este medicamento? vacuna intranasal contra la influenza Puede ser que esta lista no menciona todas las posibles interacciones. Informe a su profesional de [employer] de [employer] productos a base de hierbas, medicamentos de [place] o suplementos nutritivos que est tomando. Si usted fuma, consume bebidas alcohlicas o si utiliza drogas ilegales, indqueselo tambin a su profesional de [employer]. Algunas sustancias pueden interactuar con su medicamento. A qu debo estar atento al usar [employer]? Visite a su mdico o a su profesional de la salud para chequear su evolucin peridicamente. Si los sntomas no comienzan a mejorar o si empeoran, consulte con su mdico. Si tiene la gripe, puede tener un mayor riesgo de Environmental education officer convulsiones, confusin o comportamiento anormal. Normalmente ocurre al principio de la enfermedad y con ms frecuencia en los nios o adolescentes. Estos eventos no son comunes, Warden/ranger en lesin accidental al paciente. Las familias y los cuidadores de los pacientes deben estar atentos a signos de comportamiento inusuales y si los pacientes muestran signos de comportamiento inusuales, deben comunicarse con su mdico o su profesional de la salud inmediatamente. Este medicamento no es un sustituto de las vacunas para la gripe. Consulte a su mdico cada ao acerca de la vacuna anual para la gripe. Qu efectos secundarios puedo tener al [employer] este medicamento? Efectos secundarios que debe informar a su mdico o a Producer, television/film/video de la salud tan pronto como sea posible: Therapist, art, como erupcin cutnea, comezn/picazn o urticarias, hinchazn de la  cara, los labios o la lengua ansiedad, confusin, comportamiento inusual  problemas respiratorios alucinaciones, prdida del contacto con la realidad enrojecimiento, formacin de ampollas, descamacin o distensin de la piel, incluso dentro de la boca convulsiones Efectos secundarios que generalmente no requieren atencin mdica (debe informarlos a su mdico o a Producer, television/film/videosu profesional de la salud si persisten o si son molestos): diarrea dolor de cabeza nuseas, vmito dolor Puede ser que esta lista no menciona todos los posibles efectos secundarios. Comunquese a su mdico por asesoramiento mdico Hewlett-Packardsobre los efectos secundarios. Usted puede informar los efectos secundarios a la FDA por telfono al 1-800-FDA-1088. Dnde debo guardar mi medicina? Mantngala fuera del alcance de los nios. Gurdela a Lockheed Martinuna temperatura ambiente, de entre 15 y 30 grados C (2159 y 2586 grados F). Deseche todo el medicamento que no haya utilizado, despus de la fecha de vencimiento. ATENCIN: Este folleto es un resumen. Puede ser que no cubra toda la posible informacin. Si usted tiene preguntas acerca de esta medicina, consulte con su mdico, su farmacutico o su profesional de Radiographer, therapeuticla salud.  2019 Elsevier/Gold Standard (2017-01-08 00:00:00)   ------------------------------------------------------------------------------------------   Gripe en los adultos Influenza, Adult La gripe, tambin llamada "influenza", es una infeccin viral que afecta, principalmente, las vas respiratorias. Las vas respiratorias incluyen rganos que ayudan a Industrial/product designerrespirar, Toll Brotherscomo los pulmones, la nariz y Administratorla garganta. La gripe provoca muchos sntomas similares a los del resfro comn, junto con fiebre alta y Tourist information centre managerdolor corporal. Se transmite fcilmente de persona a persona (es contagiosa). La mejor manera de prevenir la gripe es aplicndose la vacuna contra la gripe todos los aos. Cules son las causas? La causa de esta afeccin es el virus de la influenza. Puede contraer el virus de las siguientes maneras:  Al inhalar las gotitas que estn  en el aire liberadas por la tos o el estornudo de una persona infectada.  Al tocar algo que estuvo expuesto al virus (fue contaminado) y luego tocarse la boca, nariz u ojos. Qu incrementa el riesgo? Los siguientes factores pueden hacer que usted sea propenso a Writercontraer la gripe:  No lavarse o desinfectarse las manos con frecuencia.  Tener contacto cercano con [place] personas durante la temporada de resfro y gripe.  Tocarse la boca, los ojos o la nariz sin antes lavarse ni desinfectarse las manos.  No colocarse la vacuna anual contra la gripe. Puede correr un mayor riesgo de Rancho Alegretener gripe, incluso problemas graves como una infeccin pulmonar (neumona), si:  Es mayor de 65 aos de edad.  Est embarazada.  Tiene debilitado el sistema que combate las enfermedades (sistema inmunitario). Puede tener un sistema inmunitario debilitado si: ? Tienen VIH o sndrome de inmunodeficiencia adquirida (SIDA). ? Est recibiendo quimioterapia. ? [place] medicamentos que reducen (suprimen) la actividad de su sistema inmunitario.  Tiene una enfermedad a largo plazo (crnica), como una enfermedad cardaca, enfermedad renal, diabetes o enfermedad pulmonar.  Tiene un trastorno heptico.  Tiene mucho sobrepeso (obesidad Barbadosmrbida).  Tiene anemia. Esta es una afeccin que afecta a los glbulos rojos.  Tiene asma. Cules son los signos o los sntomas? Los sntomas de esta afeccin por lo general comienzan de repente y Armando Reichertduran entre 4 y 14das. Pueden incluir los siguientes:  Grant RutsFiebre y escalofros.  Dolores de Pattisoncabeza, dolores en el cuerpo o dolores musculares.  Dolor de Advertising copywritergarganta.  Tos.  Secrecin o congestin nasal.  DentistMalestar en el pecho.  Falta de apetito.  Debilidad o fatiga.  Mareos.  Nuseas o vmitos. Cmo se diagnostica? Esta afeccin se  puede diagnosticar en funcin de lo siguiente:  Los sntomas y los antecedentes mdicos.  Un examen fsico.  Un hisopado de Portugalnariz o garganta y el  anlisis del lquido extrado para Engineer, manufacturingdetectar el virus de la gripe. Cmo se trata? Si la gripe se diagnostica de forma temprana, puede tratarse con medicamentos que pueden ayudar a reducir la gravedad de la enfermedad y reducir su duracin (medicamentos antivirales). Estos pueden administrarse por boca (va oral) o por va intravenosa. Cuidarse en su hogar tambin puede ayudar a Asbury Automotive Groupaliviar los sntomas. El mdico puede recomendarle lo siguiente:  Tomar medicamentos de Sales promotion account executiveventa libre.  Beber mucho lquido. En muchos casos, la gripe desaparece sola. Si tiene sntomas graves o complicaciones, puede tratarse en un hospital. Siga estas indicaciones en su casa: Actividad  Descanse segn sea necesario y Union Pacific Corporationduerma bien.  Lanny HurstQudese en su casa y no concurra al Aleen Campitrabajo o a la escuela como se lo haya indicado su mdico. A menos que visite al American Expressmdico, evite salir de su casa hasta que la fiebre haya desaparecido por 24 horas sin tomar medicamentos. Comida y bebida  Tome una solucin de rehidratacin oral (oral rehydration solution, ORS). Esta es una bebida que se vende en farmacias y tiendas minoristas.  Beba suficiente lquido como para Pharmacologistmantener la orina de color amarillo plido.  En la medida en que pueda, beba lquidos claros en pequeas cantidades. Beba lquidos claros, como agua, cubitos de hielo, jugos de fruta diluidos y bebidas deportivas bajas en caloras.  En la medida en que pueda, consuma alimentos blandos y fciles de digerir en pequeas cantidades. Estos alimentos incluyen bananas, compota de River[place]manzana, arroz, carnes Monticellomagras, tostadas y [address] Avenuegalletas saladas.  Evite consumir lquidos que contengan mucha azcar o cafena, como bebidas energticas, bebidas deportivas comunes y refrescos.  Evite tomar alcohol.  Evite los alimentos condimentados o con alto contenido de Ricevillegrasa. Indicaciones generales      Baxter Internationalome los medicamentos de venta libre y los recetados solamente como se lo haya indicado el  mdico.  Use un humidificador de aire fro para agregar humedad al aire de su casa. Esto puede facilitar la respiracin.  Al toser o estornudar, cbrase la boca y la Brunonariz.  Lvese las manos con agua y jabn frecuentemente, en especial despus de toser o Engineering geologistestornudar. Use desinfectante para manos con alcohol si no dispone de [place] y [place]jabn.  Concurra a todas las visitas de control como se lo haya indicado el mdico. Esto es importante. Cmo se evita?   Colquese la vacuna anual contra la gripe. Puede colocarse la vacuna contra la gripe a fines de verano, en otoo o en invierno. Pregntele al mdico cundo debe colocarse la vacuna contra la gripe.  Evite el contacto con personas que estn enfermas durante la temporada de resfro y gripe. Generalmente es durante el otoo y el invierno. Comunquese con un mdico si:  Tiene nuevos sntomas.  Tiene los siguientes sntomas: ? Journalist, newspaperDolor en el pecho. ? Diarrea. ? Fiebre.  La tos empeora.  Produce ms mucosidad.  Siente nuseas o vomita. Solicite ayuda inmediatamente si:  Le falta el aire o tiene dificultad para respirar.  La piel o las uas se tornan de un color [place] Sanilacazulado.  Presenta dolor intenso o rigidez de cuello.  Le duele la cabeza, la cara o el odo de forma repentina.  No puede comer ni beber sin vomitar. Resumen  La gripe es una infeccin viral que afecta principalmente las vas respiratorias.  Los sntomas de la gripe normalmente comienzan de repente  y Armando Reichert 6 y [address]  Colocarse la vacuna anual contra la gripe es la mejor manera de prevenir el contagio de la gripe.  Lanny Hurst en su casa y no concurra al Aleen Campi o a la escuela como se lo haya indicado su mdico. A menos que visite al [employer] Express, evite salir de su casa hasta que la fiebre haya desaparecido por 24 horas sin tomar medicamentos.  Concurra a todas las visitas de control como se lo haya indicado el mdico. Esto es importante. Esta informacin no tiene Microbiologist el consejo del mdico. Asegrese de hacerle al mdico cualquier pregunta que tenga. Document Released: 03/12/2005 Document Revised: 01/13/2018 Document Reviewed: 01/13/2018 Elsevier Interactive Patient Education  2019 [employer].

## 2018-07-12 ENCOUNTER — Other Ambulatory Visit (HOSPITAL_COMMUNITY)
Admission: RE | Admit: 2018-07-12 | Discharge: 2018-07-12 | Disposition: A | Discharge status: Home / Self Care | Payer: Self-pay | Source: Ambulatory Visit | Attending: Physician Assistant | Admitting: Physician Assistant

## 2018-07-12 DIAGNOSIS — E785 Hyperlipidemia, unspecified: Secondary | ICD-10-CM | POA: Insufficient documentation

## 2018-07-12 LAB — COMPREHENSIVE METABOLIC PANEL
ALBUMIN: 3.9 g/dL (ref 3.5–5.0)
ALK PHOS: 97 U/L (ref 38–126)
ALT: 19 U/L (ref 0–44)
AST: 21 U/L (ref 15–41)
Anion gap: 7 (ref 5–15)
BUN: 14 mg/dL (ref 6–20)
CALCIUM: 8.6 mg/dL — AB (ref 8.9–10.3)
CO2: 28 mmol/L (ref 22–32)
Chloride: 104 mmol/L (ref 98–111)
Creatinine, Ser: 0.62 mg/dL (ref 0.44–1.00)
GFR calc Af Amer: >60 mL/min (ref >60)
GFR calc non Af Amer: >60 mL/min (ref >60)
GLUCOSE: 100 mg/dL — AB (ref 70–99)
Potassium: 4.1 mmol/L (ref 3.5–5.1)
SODIUM: 139 mmol/L (ref 135–145)
Total Bilirubin: 0.8 mg/dL (ref 0.3–1.2)
Total Protein: 7.5 g/dL (ref 6.5–8.1)

## 2018-07-12 LAB — LIPID PANEL
Cholesterol: 191 mg/dL (ref 0–200)
HDL: 42 mg/dL (ref >40)
LDL CALC: 117 mg/dL — AB (ref 0–99)
Total CHOL/HDL Ratio: 4.5 RATIO
Triglycerides: 158 mg/dL — ABNORMAL HIGH (ref <150)
VLDL: 32 mg/dL (ref 0–40)

## 2018-07-22 ENCOUNTER — Encounter: Payer: Self-pay | Admitting: Physician Assistant

## 2018-07-22 ENCOUNTER — Ambulatory Visit: Payer: Self-pay | Admitting: Physician Assistant

## 2018-07-22 VITALS — BP 124/80 | HR 73 | Temp 97.7°F | Wt 235.0 lb

## 2018-07-22 DIAGNOSIS — E785 Hyperlipidemia, unspecified: Secondary | ICD-10-CM

## 2018-07-22 MED ORDER — SIMVASTATIN 20 MG PO TABS: 20.0000 mg | ORAL_TABLET | Freq: Every day | ORAL | 5 refills | Status: DC

## 2018-07-22 NOTE — Progress Notes (Signed)
BP 124/80 (BP Location: Left Arm, Patient Position: Sitting, Cuff Size: Normal)   Pulse 73   Temp 97.7 F (36.5 C)   Wt 235 lb (106.6 kg)   LMP 06/16/2008   SpO2 95%   BMI 41.63 kg/m    Subjective:    Patient ID: Heidi Gibson, female    DOB: 06-May-1966, 53 y.o.   MRN: [id number]  HPI: Heidi Gibson is a 53 y.o. female presenting on 07/22/2018 for Hyperlipidemia   HPI   Pt says she didn't take her simvastatin while she was sick so this was just before she got her labs done so her lipids are up today.      Relevant past medical, surgical, family and social history reviewed and updated as indicated. Interim medical history since our last visit reviewed. Allergies and medications reviewed and updated.   Current Outpatient Medications:  .  simvastatin (ZOCOR) 20 MG tablet, Take 1 tablet (20 mg total) by mouth daily. Tome una tableta por boca al dormir, Disp: 30 tablet, Rfl: 5    Review of Systems  Constitutional: Negative for appetite change, chills, diaphoresis, fatigue, fever and unexpected weight change.  HENT: Negative for congestion, dental problem, drooling, ear pain, facial swelling, hearing loss, mouth sores, sneezing, sore throat, trouble swallowing and voice change.   Eyes: Negative for pain, discharge, redness, itching and visual disturbance.  Respiratory: Positive for cough. Negative for choking, shortness of breath and wheezing.   Cardiovascular: Negative for chest pain, palpitations and leg swelling.  Gastrointestinal: Negative for abdominal pain, blood in stool, constipation, diarrhea and vomiting.  Endocrine: Negative for cold intolerance, heat intolerance and polydipsia.  Genitourinary: Negative for decreased urine volume, dysuria and hematuria.  Musculoskeletal: Negative for arthralgias, back pain and gait problem.  Skin: Negative for rash.  Allergic/Immunologic: Negative for environmental allergies.  Neurological: Positive  for headaches. Negative for seizures, syncope and light-headedness.  Hematological: Negative for adenopathy.  Psychiatric/Behavioral: Negative for agitation, dysphoric mood and suicidal ideas. The patient is not nervous/anxious.     Per HPI unless specifically indicated above     Objective:    BP 124/80 (BP Location: Left Arm, Patient Position: Sitting, Cuff Size: Normal)   Pulse 73   Temp 97.7 F (36.5 C)   Wt 235 lb (106.6 kg)   LMP 06/16/2008   SpO2 95%   BMI 41.63 kg/m   Wt Readings from Last 3 Encounters:  07/22/18 235 lb (106.6 kg)  07/06/18 232 lb 8 oz (105.5 kg)  03/02/18 246 lb (111.6 kg)    Physical Exam Vitals signs reviewed.  Constitutional:      Appearance: She is well-developed.  HENT:     Head: Normocephalic and atraumatic.  Neck:     Musculoskeletal: Neck supple.  Cardiovascular:     Rate and Rhythm: Normal rate and regular rhythm.  Pulmonary:     Effort: Pulmonary effort is normal.     Breath sounds: Normal breath sounds.  Abdominal:     General: Bowel sounds are normal.     Palpations: Abdomen is soft. There is no mass.     Tenderness: There is no abdominal tenderness.  Musculoskeletal:        General: No tenderness.     Right lower leg: No edema.     Left lower leg: No edema.  Lymphadenopathy:     Cervical: No cervical adenopathy.  Skin:    General: Skin is warm and dry.  Neurological:  Mental Status: She is alert and oriented to person, place, and time.  Psychiatric:        Behavior: Behavior normal.     Results for orders placed or performed during the hospital encounter of 07/12/18  Lipid panel  Result Value Ref Range   Cholesterol 191 0 - 200 mg/dL   Triglycerides 130158 (H) <150 mg/dL   HDL 42 >86>40 mg/dL   Total CHOL/HDL Ratio 4.5 RATIO   VLDL 32 0 - 40 mg/dL   LDL Cholesterol 578117 (H) 0 - 99 mg/dL  Comprehensive metabolic panel  Result Value Ref Range   Sodium 139 135 - 145 mmol/L   Potassium 4.1 3.5 - 5.1 mmol/L   Chloride  104 98 - 111 mmol/L   CO2 28 22 - 32 mmol/L   Glucose, Bld 100 (H) 70 - 99 mg/dL   BUN 14 6 - 20 mg/dL   Creatinine, Ser 4.690.62 0.44 - 1.00 mg/dL   Calcium 8.6 (L) 8.9 - 10.3 mg/dL   Total Protein 7.5 6.5 - 8.1 g/dL   Albumin 3.9 3.5 - 5.0 g/dL   AST 21 15 - 41 U/L   ALT 19 0 - 44 U/L   Alkaline Phosphatase 97 38 - 126 U/L   Total Bilirubin 0.8 0.3 - 1.2 mg/dL   GFR calc non Af Amer >60 >60 mL/min   GFR calc Af Amer >60 >60 mL/min   Anion gap 7 5 - 15      Assessment & Plan:   Encounter Diagnoses  Name Primary?  . Hyperlipidemia, unspecified hyperlipidemia type Yes  . Morbid obesity, unspecified obesity type (HCC)     -reviewed labs with pt -Pt counseled to return iFOBT given in august -recommended IBU or APAP and ice/heat to leg prn -pt to follow up 6 months.  RTO sooner prn

## 2018-10-27 ENCOUNTER — Telehealth: Payer: Self-pay | Admitting: Student

## 2018-10-27 NOTE — Telephone Encounter (Signed)
LPN explained to patient that she is already being treated for her sx and she is to finish her antibiotic as directed by the urgent care doctor (pt is on day 5/10).  LPN recommended for patient to continue antibiotic, gargle with warm salt water, and to avoid acidic foods. Pt is to call back for an appointment if she is still without improvements after finishing antibiotics. Pt verbalized understanding.

## 2018-11-11 ENCOUNTER — Ambulatory Visit: Payer: Self-pay | Admitting: Physician Assistant

## 2018-11-11 ENCOUNTER — Encounter: Payer: Self-pay | Admitting: Physician Assistant

## 2018-11-11 DIAGNOSIS — J029 Acute pharyngitis, unspecified: Secondary | ICD-10-CM

## 2018-11-11 DIAGNOSIS — B349 Viral infection, unspecified: Secondary | ICD-10-CM

## 2018-11-11 MED ORDER — NYSTATIN 100000 UNIT/ML MT SUSP: OROMUCOSAL | 0 refills | Status: DC

## 2018-11-11 NOTE — Progress Notes (Signed)
LMP 06/16/2008    Subjective:    Patient ID: Heidi Gibson, female    DOB: Aug 23, 1965, 53 y.o.   MRN: [id number]  HPI: Heidi Gibson is a 53 y.o. female presenting on 11/11/2018 for No chief complaint on file.   HPI   This is a telemedicine appointment through [facility] due to coronavirus pandemic  I connected with  Heidi Gibson on 11/11/18 by a video enabled telemedicine application and verified that I am speaking with the correct person using two identifiers.   I discussed the limitations of evaluation and management by telemedicine. The patient expressed understanding and agreed to proceed.  Pt is at home.  Provider is in office/clinic  Pt went to Urgent care on 10/23/18 for Sore Throat and was given rx for amoxil for 10 days and she finished it.  She says she is still sick.  She is currently having sores in her mouth and her tongue feels dry.   She says her tongue is white and feels thick. Warm salt water gargles are helping the sore throat.    Pt says she has No fever.  She does have Some cough which started yesterday.  Some L ear pain yesterday but that is improved today.  No n/v.  No diarrhea.  She only has mucus when she is cleaning her tongue..  She does have Some HA pm the Frontal/top part of her head.  She says APAP helps the HA.  He is Not having body aches.    Her oldest son had vertigo and he was seen in the hospital last week.  Her son was quarantined by his provider because he was showing signs of CV19 but he didn't get tested. (his nose and mouth were red)-  That was last week.  That was on may 13.    There are 4 people living in the home.     Relevant past medical, surgical, family and social history reviewed and updated as indicated. Interim medical history since our last visit reviewed. Allergies and medications reviewed and updated.    Current Outpatient Medications:  .  simvastatin (ZOCOR) 20 MG tablet, Take 1  tablet (20 mg total) by mouth daily. Tome una tableta por boca al dormir, Disp: 30 tablet, Rfl: 5   Review of Systems  Per HPI unless specifically indicated above     Objective:    LMP 06/16/2008   Wt Readings from Last 3 Encounters:  07/22/18 235 lb (106.6 kg)  07/06/18 232 lb 8 oz (105.5 kg)  03/02/18 246 lb (111.6 kg)    Physical Exam Constitutional:      General: She is not in acute distress.    Appearance: She is obese. She is not ill-appearing.  HENT:     Head: Normocephalic and atraumatic.     Mouth/Throat:     Mouth: Mucous membranes are moist.     Pharynx: Oropharynx is clear. No pharyngeal swelling, oropharyngeal exudate or posterior oropharyngeal erythema.  Pulmonary:     Effort: Pulmonary effort is normal. No respiratory distress.  Neurological:     Mental Status: She is alert and oriented to person, place, and time.  Psychiatric:        Attention and Perception: Attention normal.        Mood and Affect: Mood normal.        Speech: Speech normal.        Behavior: Behavior is cooperative.  Cognition and Memory: Cognition normal.         Assessment & Plan:    Encounter Diagnoses  Name Primary?  . Sore throat Yes  . Viral illness      -Will send rx for nystatin s&s to help with tongue -Encourage pt to continue with warm salt water gargles -offered to get pt tested for coronavirus bt Pt declined CV19 test -pt told to Do not return to work until symtpoms gone -pt will be given Note for work-Will fax- RTW 14 days -lenghty converstaion with pt about isolation -pt to contact clinic for any worsening or new symptoms

## 2018-12-20 ENCOUNTER — Other Ambulatory Visit: Payer: Self-pay | Admitting: Physician Assistant

## 2019-01-10 ENCOUNTER — Encounter: Payer: Self-pay | Admitting: Physician Assistant

## 2019-01-10 ENCOUNTER — Ambulatory Visit: Payer: Self-pay | Admitting: Physician Assistant

## 2019-01-10 DIAGNOSIS — L304 Erythema intertrigo: Secondary | ICD-10-CM

## 2019-01-10 MED ORDER — CLOTRIMAZOLE-BETAMETHASONE 1-0.05 % EX CREA: TOPICAL_CREAM | CUTANEOUS | 0 refills | Status: DC

## 2019-01-10 NOTE — Progress Notes (Signed)
   LMP 06/16/2008    Subjective:    Patient ID: Heidi Gibson, female    DOB: 05-03-1966, 53 y.o.   MRN: [id number]  HPI: Heidi Gibson is a 53 y.o. female presenting on 01/10/2019 for Rash (sx began on Saturday, 01-08-2019. pt states rash under R breast but is moving toward the L. pt has been using triple antibiotic ointment which helps a little.)   HPI  Chief Complaint  Patient presents with  . Rash    sx began on Saturday, 01-08-2019. pt states rash under R breast but is moving toward the L. pt has been using triple antibiotic ointment which helps a little.     This is a telemedicine appointment through [facility] due to coronavirus pandemic  I connected with  [place] on 01/10/19 by a video enabled telemedicine application and verified that I am speaking with the correct person using two identifiers.   I discussed the limitations of evaluation and management by telemedicine. The patient expressed understanding and agreed to proceed.  Pt is at home.  Provider is at office.   Rash as above.     Relvant past medical, surgical, family and social history reviewed and updated as indicated. Interim medical history since our last visit reviewed. Allergies and medications reviewed and updated.   Current Outpatient Medications:  .  simvastatin (ZOCOR) 20 MG tablet, TOME UNA TABLETA POR BOCA AL DORMIR., Disp: 30 tablet, Rfl: 3   Review of Systems  Per HPI unless specifically indicated above     Objective:    LMP 06/16/2008   Wt Readings from Last 3 Encounters:  07/22/18 235 lb (106.6 kg)  07/06/18 232 lb 8 oz (105.5 kg)  03/02/18 246 lb (111.6 kg)    Physical Exam Constitutional:      General: She is not in acute distress.    Appearance: Normal appearance. She is obese. She is not ill-appearing.  HENT:     Head: Normocephalic and atraumatic.  Pulmonary:     Effort: Pulmonary effort is normal. No respiratory distress.   Skin:    Findings: Rash present.     Comments: Red rash in crease under right breast.  No discrete lesion.   Neurological:     Mental Status: She is alert and oriented to person, place, and time.  Psychiatric:        Attention and Perception: Attention normal.        Speech: Speech normal.        Behavior: Behavior is cooperative.           Assessment & Plan:    Encounter Diagnosis  Name Primary?  . Intertrigo Yes      -rx lotrisone to [employer] -counseled pt on keeping the area dry -pt to follow up next week for evisit for lipids as scheduled

## 2019-01-17 ENCOUNTER — Other Ambulatory Visit (HOSPITAL_COMMUNITY)
Admission: RE | Admit: 2019-01-17 | Discharge: 2019-01-17 | Disposition: A | Discharge status: Home / Self Care | Payer: Self-pay | Source: Ambulatory Visit | Attending: Physician Assistant | Admitting: Physician Assistant

## 2019-01-17 DIAGNOSIS — E785 Hyperlipidemia, unspecified: Secondary | ICD-10-CM | POA: Insufficient documentation

## 2019-01-17 LAB — COMPREHENSIVE METABOLIC PANEL
ALT: 17 U/L (ref 0–44)
AST: 17 U/L (ref 15–41)
Albumin: 4 g/dL (ref 3.5–5.0)
Alkaline Phosphatase: 108 U/L (ref 38–126)
Anion gap: 8 (ref 5–15)
BUN: 11 mg/dL (ref 6–20)
CO2: 28 mmol/L (ref 22–32)
Calcium: 9 mg/dL (ref 8.9–10.3)
Chloride: 105 mmol/L (ref 98–111)
Creatinine, Ser: 0.65 mg/dL (ref 0.44–1.00)
GFR calc Af Amer: >60 mL/min (ref >60)
GFR calc non Af Amer: >60 mL/min (ref >60)
Glucose, Bld: 109 mg/dL — ABNORMAL HIGH (ref 70–99)
Potassium: 4.1 mmol/L (ref 3.5–5.1)
Sodium: 141 mmol/L (ref 135–145)
Total Bilirubin: 0.6 mg/dL (ref 0.3–1.2)
Total Protein: 7.6 g/dL (ref 6.5–8.1)

## 2019-01-17 LAB — LIPID PANEL
Cholesterol: 166 mg/dL (ref 0–200)
HDL: 45 mg/dL (ref >40)
LDL Cholesterol: 98 mg/dL (ref 0–99)
Total CHOL/HDL Ratio: 3.7 RATIO
Triglycerides: 116 mg/dL (ref <150)
VLDL: 23 mg/dL (ref 0–40)

## 2019-01-20 ENCOUNTER — Encounter: Payer: Self-pay | Admitting: Physician Assistant

## 2019-01-20 ENCOUNTER — Ambulatory Visit: Payer: Self-pay | Admitting: Physician Assistant

## 2019-01-20 DIAGNOSIS — E669 Obesity, unspecified: Secondary | ICD-10-CM

## 2019-01-20 DIAGNOSIS — E785 Hyperlipidemia, unspecified: Secondary | ICD-10-CM

## 2019-01-20 DIAGNOSIS — L304 Erythema intertrigo: Secondary | ICD-10-CM

## 2019-01-20 MED ORDER — CLOTRIMAZOLE-BETAMETHASONE 1-0.05 % EX CREA: TOPICAL_CREAM | CUTANEOUS | 0 refills | Status: DC

## 2019-01-20 MED ORDER — SIMVASTATIN 20 MG PO TABS: ORAL_TABLET | ORAL | 4 refills | Status: DC

## 2019-01-20 NOTE — Patient Instructions (Signed)
Intertrigo Intertrigo El intertrigo es una irritacin o inflamacin de la piel (dermatitis) que ocurre cuando los pliegues de la piel se rozan entre s. La irritacin puede causar una erupcin cutnea, y que la piel quede en carne viva y con picazn. Esta afeccin es ms frecuente en los pliegues de la piel de esta reas:  Los dedos de los pies.  Las [place].  La ingle.  Debajo del vientre.  [place] mamas.  Las nalgas. El intertrigo no se transmite de [place] persona a otra (no es contagioso). Cules son las causas? Esta afeccin es causada por el calor, la humedad, el roce (friccin) y la falta de una buena circulacin de aire. Esta afeccin puede empeorar debido a lo siguiente:  Transpiracin.  Bacterias.  Un hongo, como las levaduras. Qu incrementa el riesgo? Esta afeccin es ms probable que ocurra si tiene [employer] de la piel. Es ms probable que tenga esta afeccin si:  Tiene diabetes.  Tiene sobrepeso.  No puede moverse o no realiza [place].  Vive en un rea de clima clido y hmedo.  [place] frulas, dispositivos ortopdicos u otros dispositivos mdicos.  No puede controlar los intestinos o la vejiga (tiene incontinencia). Cules son los signos o los sntomas? Los sntomas de esta afeccin incluyen los siguientes:  Una erupcin cutnea rosa o roja en el pliegue de la piel o cerca del pliegue de la piel.  Piel en [place].  Picazn.  Sensacin de ardor.  Sangrado.  Secrecin de lquido.  Mal olor. Cmo se diagnostica? Esta afeccin se diagnostica mediante una revisin de los antecedentes mdicos y un examen fsico. Es posible que le indiquen un hisopado cutneo para detectar bacterias u hongos. Cmo se trata? El tratamiento para esta afeccin puede incluir lo siguiente:  Theatre manager la piel limpia y Audiological scientist.  Tomar un antibitico o usar una crema antibitica en la piel para combatir una infeccin bacteriana.  Usar una  crema antimictica en la piel o tomar pldoras para tratar una infeccin causada por hongos, como levaduras.  Usar una pomada con corticoesteroides para Human resources officer picazn y Conservation officer, nature.  Separar el pliegue de la piel con un pao de algodn limpio para que absorba la humedad y permita que circule aire por la zona. Siga estas indicaciones en su casa:  Mantenga la zona afectada limpia y seca.  No se rasque la piel.  Permanezca en un ambiente fresco tanto como sea posible. Si es factible, use el aire acondicionado o Programmer, systems.  Aplquese los medicamentos de venta libre y los recetados solamente como se lo haya indicado el mdico.  Si le recetaron un antibitico, tmelo como se lo haya indicado el mdico. No deje de usar el antibitico aunque la afeccin mejore.  Concurra a todas las visitas de seguimiento como se lo haya indicado el mdico. Esto es importante. Cmo se evita?   Mantenga un peso saludable.  Cuide sus pies, especialmente si es diabtico. El cuidado de los pies incluye los siguiente: ? Usar zapatos que calcen bien. ? Public affairs consultant. ? Usar calcetines limpios y transpirables.  Proteja la piel alrededor de la ingle y las nalgas, especialmente si tiene incontinencia. La proteccin de la piel incluye lo siguiente: ? Seguir una rutina de limpieza regular. ? Usar cremas, polvos o pomadas que protejan la piel. ? Cambiar los apsitos protectores con frecuencia.  No use ropa ajustada. Use ropa suelta, absorbente y hecha de algodn.  Use un sostn que  proporcione buen soporte, si es necesario.  Dchese y squese bien despus de realizar una actividad o de hacer ejercicio. Use un secador con aire fro para secar la [employer] [place] entre los pliegues, especialmente despus de baarse.  Si tiene diabetes, mantenga bajo control el nivel de Bankerazcar en la sangre. Comunquese con un mdico si:  Los sntomas no mejoran con Scientist, research (medical)el tratamiento.  Los sntomas empeoran o se extienden  a Corporate treasurerotras partes del cuerpo.  Nota ms enrojecimiento y calor.  Tiene fiebre. Resumen  El intertrigo es una irritacin o inflamacin de la piel (dermatitis) que ocurre cuando los pliegues de la piel se rozan entre s.  Esta afeccin es causada por el calor, la humedad, el roce (friccin) y la falta de una buena circulacin de aire.  Esta afeccin puede tratarse limpiando y secando la piel y Gilgousando medicamentos.  Aplquese los medicamentos de venta libre y los recetados solamente como se lo haya indicado el mdico.  OceanographerConcurra a todas las visitas de seguimiento como se lo haya indicado el mdico. Esto es importante. Esta informacin no tiene Theme park managercomo fin reemplazar el consejo del mdico. Asegrese de hacerle al mdico cualquier pregunta que tenga. Document Released: 06/02/2005 Document Revised: 12/10/2017 Document Reviewed: 12/10/2017 Elsevier Patient Education  2020 [employer]

## 2019-01-20 NOTE — Progress Notes (Signed)
LMP 06/16/2008    Subjective:    Patient ID: Heidi Gibson, female    DOB: [birth year], 53 y.o.   MRN: [id number]  HPI: Heidi Gibson is a 53 y.o. female presenting on 01/20/2019 for Hyperlipidemia and Rash   HPI  This is a telemedicine appointment through [facility] due to coronavirus pandemic.    I connected with  Heidi Gibson on 01/20/19 by a video enabled telemedicine application and verified that I am speaking with the correct person using two identifiers.   I discussed the limitations of evaluation and management by telemedicine. The patient expressed understanding and agreed to proceed.  Pt is at home.  Provider is at office.   Pt says she still has rash under R breast.  She is using the lotrisone and states she has not been wearing her bra.  She says that it is improved some from 01/10/19  When she was seen for the rash initially.   She has no other complaints today.    Relevant past medical, surgical, family and social history reviewed and updated as indicated. Interim medical history since our last visit reviewed. Allergies and medications reviewed and updated.   Current Outpatient Medications:  .  clotrimazole-betamethasone (LOTRISONE) cream, Apply to affected area bid x 14 days.  aplique a la area [employer] al dia por 14 dias., Disp: 30 g, Rfl: 0 .  simvastatin (ZOCOR) 20 MG tablet, TOME UNA TABLETA POR BOCA AL DORMIR., Disp: 30 tablet, Rfl: 3   Review of Systems  Per HPI unless specifically indicated above     Objective:    LMP 06/16/2008   Wt Readings from Last 3 Encounters:  07/22/18 235 lb (106.6 kg)  07/06/18 232 lb 8 oz (105.5 kg)  03/02/18 246 lb (111.6 kg)    Physical Exam Constitutional:      General: She is not in acute distress.    Appearance: Normal appearance. She is obese. She is not ill-appearing.  HENT:     Head: Normocephalic and atraumatic.  Pulmonary:     Effort: Pulmonary  effort is normal. No respiratory distress.  Skin:    Findings: Rash present.     Comments: Rash still present under the breast.  It appears improved from last week.    Neurological:     Mental Status: She is alert and oriented to person, place, and time.  Psychiatric:        Attention and Perception: Attention normal.        Speech: Speech normal.        Behavior: Behavior is cooperative.     Results for orders placed or performed during the hospital encounter of 01/17/19  Comprehensive metabolic panel  Result Value Ref Range   Sodium 141 135 - 145 mmol/L   Potassium 4.1 3.5 - 5.1 mmol/L   Chloride 105 98 - 111 mmol/L   CO2 28 22 - 32 mmol/L   Glucose, Bld 109 (H) 70 - 99 mg/dL   BUN 11 6 - 20 mg/dL   Creatinine, Ser 0.65 0.44 - 1.00 mg/dL   Calcium 9.0 8.9 - 10.3 mg/dL   Total Protein 7.6 6.5 - 8.1 g/dL   Albumin 4.0 3.5 - 5.0 g/dL   AST 17 15 - 41 U/L   ALT 17 0 - 44 U/L   Alkaline Phosphatase 108 38 - 126 U/L   Total Bilirubin 0.6 0.3 - 1.2 mg/dL   GFR calc non  Af Amer >60 >60 mL/min   GFR calc Af Amer >60 >60 mL/min   Anion gap 8 5 - 15  Lipid panel  Result Value Ref Range   Cholesterol 166 0 - 200 mg/dL   Triglycerides 161116 <096<150 mg/dL   HDL 45 >04>40 mg/dL   Total CHOL/HDL Ratio 3.7 RATIO   VLDL 23 0 - 40 mg/dL   LDL Cholesterol 98 0 - 99 mg/dL      Assessment & Plan:    Encounter Diagnoses  Name Primary?  . Hyperlipidemia, unspecified hyperlipidemia type Yes  . Intertrigo   . Obesity, unspecified classification, unspecified obesity type, unspecified whether serious comorbidity present     -reviewed labs with pt  -refilled lotrisone -pt to continue simvastatin for lipids -pt to Follow up 3 months.  She is to contact office sooner prn

## 2019-04-25 ENCOUNTER — Other Ambulatory Visit (HOSPITAL_COMMUNITY)
Admission: RE | Admit: 2019-04-25 | Discharge: 2019-04-25 | Disposition: A | Discharge status: Home / Self Care | Payer: Self-pay | Source: Ambulatory Visit | Attending: Physician Assistant | Admitting: Physician Assistant

## 2019-04-25 DIAGNOSIS — E785 Hyperlipidemia, unspecified: Secondary | ICD-10-CM | POA: Insufficient documentation

## 2019-04-25 LAB — COMPREHENSIVE METABOLIC PANEL
ALT: 25 U/L (ref 0–44)
AST: 22 U/L (ref 15–41)
Albumin: 4 g/dL (ref 3.5–5.0)
Alkaline Phosphatase: 118 U/L (ref 38–126)
Anion gap: 9 (ref 5–15)
BUN: 13 mg/dL (ref 6–20)
CO2: 27 mmol/L (ref 22–32)
Calcium: 8.8 mg/dL — ABNORMAL LOW (ref 8.9–10.3)
Chloride: 103 mmol/L (ref 98–111)
Creatinine, Ser: 0.51 mg/dL (ref 0.44–1.00)
GFR calc Af Amer: >60 mL/min (ref >60)
GFR calc non Af Amer: >60 mL/min (ref >60)
Glucose, Bld: 101 mg/dL — ABNORMAL HIGH (ref 70–99)
Potassium: 3.8 mmol/L (ref 3.5–5.1)
Sodium: 139 mmol/L (ref 135–145)
Total Bilirubin: 0.4 mg/dL (ref 0.3–1.2)
Total Protein: 7.4 g/dL (ref 6.5–8.1)

## 2019-04-25 LAB — LIPID PANEL
Cholesterol: 209 mg/dL — ABNORMAL HIGH (ref 0–200)
HDL: 53 mg/dL (ref >40)
LDL Cholesterol: 132 mg/dL — ABNORMAL HIGH (ref 0–99)
Total CHOL/HDL Ratio: 3.9 RATIO
Triglycerides: 119 mg/dL (ref <150)
VLDL: 24 mg/dL (ref 0–40)

## 2019-04-26 ENCOUNTER — Ambulatory Visit: Payer: Self-pay | Admitting: Physician Assistant

## 2019-05-03 ENCOUNTER — Encounter: Payer: Self-pay | Admitting: Physician Assistant

## 2019-05-03 ENCOUNTER — Ambulatory Visit: Payer: Self-pay | Admitting: Physician Assistant

## 2019-05-03 DIAGNOSIS — Z1239 Encounter for other screening for malignant neoplasm of breast: Secondary | ICD-10-CM

## 2019-05-03 DIAGNOSIS — E785 Hyperlipidemia, unspecified: Secondary | ICD-10-CM

## 2019-05-03 DIAGNOSIS — M25511 Pain in right shoulder: Secondary | ICD-10-CM

## 2019-05-03 DIAGNOSIS — E669 Obesity, unspecified: Secondary | ICD-10-CM

## 2019-05-03 MED ORDER — DICLOFENAC SODIUM 75 MG PO TBEC: DELAYED_RELEASE_TABLET | ORAL | 1 refills | Status: DC

## 2019-05-03 MED ORDER — SIMVASTATIN 20 MG PO TABS: ORAL_TABLET | ORAL | 4 refills | Status: DC

## 2019-05-03 NOTE — Progress Notes (Signed)
LMP 06/16/2008    Subjective:    Patient ID: Heidi Gibson, female    DOB: Dec 28, 1965, 53 y.o.   MRN: [id number]  HPI: Heidi Gibson is a 53 y.o. female presenting on 05/03/2019 for Hyperlipidemia   HPI  This is a telemedicine appointment through [facility] due to coronavirus pandemic  I connected with  Kinde on 05/03/19 by a video enabled telemedicine application and verified that I am speaking with the correct person using two identifiers.   I discussed the limitations of evaluation and management by telemedicine. The patient expressed understanding and agreed to proceed.  Pt is at home.  Provider is at office.      Pt has not been out of her simvastatin.  She has changed to way she eats lately.    Pt complains of R should pain x 1 month.  She says she has to lift and lower heavy boxes at work.  She works at a [employer] m[employer] in [place].   She hasn't notified her employer because she I hasn't been working lately- not in the past month.  She says the Pain comes and goes.  She has taken nothing to help the pain.    Pt says that other than the shoulder pain, she is doing well.       Relevant past medical, surgical, family and social history reviewed and updated as indicated. Interim medical history since our last visit reviewed. Allergies and medications reviewed and updated.    Current Outpatient Medications:  .  simvastatin (ZOCOR) 20 MG tablet, TOME UNA TABLETA POR BOCA AL DORMIR., Disp: 30 tablet, Rfl: 4    Review of Systems  Per HPI unless specifically indicated above     Objective:    LMP 06/16/2008   Wt Readings from Last 3 Encounters:  07/22/18 235 lb (106.6 kg)  07/06/18 232 lb 8 oz (105.5 kg)  03/02/18 246 lb (111.6 kg)    Physical Exam Constitutional:      General: She is not in acute distress.    Appearance: She is obese. She is not ill-appearing.  HENT:     Head: Normocephalic and  atraumatic.  Pulmonary:     Effort: Pulmonary effort is normal. No respiratory distress.  Musculoskeletal:     Right shoulder: She exhibits pain. She exhibits normal range of motion and no deformity.     Left shoulder: She exhibits normal range of motion, no swelling and no deformity.     Comments: Pain R shoulder with internal rotation.  Mild discomfort noted with extension past 90 degrees.   Neurological:     Mental Status: She is alert and oriented to person, place, and time.  Psychiatric:        Attention and Perception: Attention normal.        Mood and Affect: Mood normal.        Speech: Speech normal.        Behavior: Behavior is cooperative.          Assessment & Plan:     1. Dyslipidemia  Reviewed labs with pt.  Pt is to continue her simvastatin.  She is counseled to follow a lowfat diet and regular exercise will also help.    2. Right shoulder pain  rx diclofenac that she is to use Daily for 2 week then as needed.  She is counseled to use ice/heat as well and avoid carrying her purse on that shoulder.  No overhead lifting.  3. HCM  Will refer for screening Mammogram   Pt to follow up  3 months.  She is to contact office sooner prn

## 2019-05-11 ENCOUNTER — Other Ambulatory Visit: Payer: Self-pay

## 2019-05-11 DIAGNOSIS — Z20822 Contact with and (suspected) exposure to covid-19: Secondary | ICD-10-CM

## 2019-05-12 LAB — NOVEL CORONAVIRUS, NAA: SARS-CoV-2, NAA: DETECTED — AB

## 2019-06-20 ENCOUNTER — Other Ambulatory Visit: Payer: Self-pay | Admitting: Physician Assistant

## 2019-06-20 DIAGNOSIS — Z1239 Encounter for other screening for malignant neoplasm of breast: Secondary | ICD-10-CM

## 2019-07-04 ENCOUNTER — Other Ambulatory Visit: Payer: Self-pay

## 2019-07-04 ENCOUNTER — Ambulatory Visit (HOSPITAL_COMMUNITY)
Admission: RE | Admit: 2019-07-04 | Discharge: 2019-07-04 | Disposition: A | Discharge status: Home / Self Care | Payer: Self-pay | Source: Ambulatory Visit | Attending: Physician Assistant | Admitting: Physician Assistant

## 2019-07-04 DIAGNOSIS — Z1239 Encounter for other screening for malignant neoplasm of breast: Secondary | ICD-10-CM | POA: Insufficient documentation

## 2019-08-05 ENCOUNTER — Other Ambulatory Visit: Payer: Self-pay

## 2019-08-05 ENCOUNTER — Other Ambulatory Visit (HOSPITAL_COMMUNITY)
Admission: RE | Admit: 2019-08-05 | Discharge: 2019-08-05 | Disposition: A | Discharge status: Home / Self Care | Payer: Self-pay | Source: Ambulatory Visit | Attending: Physician Assistant | Admitting: Physician Assistant

## 2019-08-05 DIAGNOSIS — E785 Hyperlipidemia, unspecified: Secondary | ICD-10-CM | POA: Insufficient documentation

## 2019-08-05 LAB — LIPID PANEL
Cholesterol: 156 mg/dL (ref 0–200)
HDL: 49 mg/dL (ref >40)
LDL Cholesterol: 92 mg/dL (ref 0–99)
Total CHOL/HDL Ratio: 3.2 RATIO
Triglycerides: 76 mg/dL (ref <150)
VLDL: 15 mg/dL (ref 0–40)

## 2019-08-05 LAB — COMPREHENSIVE METABOLIC PANEL
ALT: 16 U/L (ref 0–44)
AST: 17 U/L (ref 15–41)
Albumin: 4.1 g/dL (ref 3.5–5.0)
Alkaline Phosphatase: 87 U/L (ref 38–126)
Anion gap: 10 (ref 5–15)
BUN: 9 mg/dL (ref 6–20)
CO2: 28 mmol/L (ref 22–32)
Calcium: 8.9 mg/dL (ref 8.9–10.3)
Chloride: 104 mmol/L (ref 98–111)
Creatinine, Ser: 0.66 mg/dL (ref 0.44–1.00)
GFR calc Af Amer: >60 mL/min (ref >60)
GFR calc non Af Amer: >60 mL/min (ref >60)
Glucose, Bld: 103 mg/dL — ABNORMAL HIGH (ref 70–99)
Potassium: 3.9 mmol/L (ref 3.5–5.1)
Sodium: 142 mmol/L (ref 135–145)
Total Bilirubin: 0.8 mg/dL (ref 0.3–1.2)
Total Protein: 7.4 g/dL (ref 6.5–8.1)

## 2019-08-08 ENCOUNTER — Encounter: Payer: Self-pay | Admitting: Physician Assistant

## 2019-08-08 ENCOUNTER — Ambulatory Visit: Payer: Self-pay | Admitting: Physician Assistant

## 2019-08-08 DIAGNOSIS — E785 Hyperlipidemia, unspecified: Secondary | ICD-10-CM

## 2019-08-08 DIAGNOSIS — R42 Dizziness and giddiness: Secondary | ICD-10-CM

## 2019-08-08 DIAGNOSIS — R140 Abdominal distension (gaseous): Secondary | ICD-10-CM

## 2019-08-08 MED ORDER — SIMVASTATIN 20 MG PO TABS: ORAL_TABLET | ORAL | 4 refills | Status: DC

## 2019-08-08 NOTE — Progress Notes (Signed)
LMP 06/16/2008    Subjective:    Patient ID: Heidi Gibson, female    DOB: 11-Jun-1966, 54 y.o.   MRN: [id number]  HPI: Heidi Gibson is a 54 y.o. female presenting on 08/08/2019 for No chief complaint on file.   HPI  This is a telemedicine appointment due to coronavirus pandemic.  It is via Telephone as pt was unable to get connected through [employer] today.  Pt was scheduled to come into the office today for in-person appointment but it had to be changed at the last minute due to unforeseen circumstances.  I connected with  Heidi Gibson on 08/08/19 by a video enabled telemedicine application and verified that I am speaking with the correct person using two identifiers.   I discussed the limitations of evaluation and management by telemedicine. The patient expressed understanding and agreed to proceed.  Pt is at home.  Provider is working from home office.  Translator is at office.    Pt is 83yoF with follow up appointment for dyslipidemia  Pt complains of Dizzy for 3 days.   She says she feels bloated and burps a lot.  She says this has been going for month when she started eating healthier.  She says she eats lots of fruits and vegetables including broccoli and cauliflower.  She has Lost weight- weighs 220 now  She had covid in November and she thinks her appetite has been lower since then.  Also she is tyring to watch what she eats due to the cholesterol.    She is eating  A lot of fresh fruits and vegetables  She says her Dizziness comes and goes.  It usually lasts about an hour.  She is Not dizzy with standing.     She says her vision gets dark when she feels dizzy which she describes as her Eyes seem as if it's cloudy outside.  She feels like she is going to fall.  She has No spinning sensation.  She Also has HA when she feels dizzy.  She doesn't feel weak but feels tired like she needs to take a nap afterwards.    She feels like  this has been happening since the virus in November.      She seems normally when she isn't feeling dizzy.       Relevant past medical, surgical, family and social history reviewed and updated as indicated. Interim medical history since our last visit reviewed. Allergies and medications reviewed and updated.    Current Outpatient Medications:  .  simvastatin (ZOCOR) 20 MG tablet, TOME UNA TABLETA POR BOCA AL DORMIR., Disp: 30 tablet, Rfl: 4    Review of Systems  Per HPI unless specifically indicated above     Objective:    LMP 06/16/2008   Wt Readings from Last 3 Encounters:  07/22/18 235 lb (106.6 kg)  07/06/18 232 lb 8 oz (105.5 kg)  03/02/18 246 lb (111.6 kg)    Physical Exam Pulmonary:     Effort: No respiratory distress.  Neurological:     Mental Status: She is alert and oriented to person, place, and time.  Psychiatric:        Attention and Perception: Attention normal.        Speech: Speech normal.        Behavior: Behavior is cooperative.      Exam limited by format  Results for orders placed or performed during the hospital encounter of 08/05/19  Lipid panel  Result Value Ref Range   Cholesterol 156 0 - 200 mg/dL   Triglycerides 76 <944 mg/dL   HDL 49 >96 mg/dL   Total CHOL/HDL Ratio 3.2 RATIO   VLDL 15 0 - 40 mg/dL   LDL Cholesterol 92 0 - 99 mg/dL  Comprehensive metabolic panel  Result Value Ref Range   Sodium 142 135 - 145 mmol/L   Potassium 3.9 3.5 - 5.1 mmol/L   Chloride 104 98 - 111 mmol/L   CO2 28 22 - 32 mmol/L   Glucose, Bld 103 (H) 70 - 99 mg/dL   BUN 9 6 - 20 mg/dL   Creatinine, Ser 7.59 0.44 - 1.00 mg/dL   Calcium 8.9 8.9 - 16.3 mg/dL   Total Protein 7.4 6.5 - 8.1 g/dL   Albumin 4.1 3.5 - 5.0 g/dL   AST 17 15 - 41 U/L   ALT 16 0 - 44 U/L   Alkaline Phosphatase 87 38 - 126 U/L   Total Bilirubin 0.8 0.3 - 1.2 mg/dL   GFR calc non Af Amer >60 >60 mL/min   GFR calc Af Amer >60 >60 mL/min   Anion gap 10 5 - 15      Assessment &  Plan:    Encounter Diagnoses  Name Primary?  . Hyperlipidemia, unspecified hyperlipidemia type Yes  . Dizziness and giddiness   . Abdominal bloating       -Reviewed lipids with pt -Pt to continue simvastatin -recommended she Try gas-x to help with bloating and burping.  Discussed that sometimes healthier foods can make her gassier -pt encouraged to get plenty of rest and get plenty fluids and eat nutritiously.  Can provide note for work if needed. -pt to contact office if not started to feel better 1 week or call sooner for new symtpoms or worsening -Otherwise will follow up for lipids in 3 months   It symptoms (dizziness) fail to resolve by next monday

## 2019-10-26 ENCOUNTER — Other Ambulatory Visit: Payer: Self-pay | Admitting: Physician Assistant

## 2019-10-26 DIAGNOSIS — E785 Hyperlipidemia, unspecified: Secondary | ICD-10-CM

## 2019-10-27 ENCOUNTER — Encounter: Payer: Self-pay | Admitting: Physician Assistant

## 2019-10-27 ENCOUNTER — Other Ambulatory Visit (HOSPITAL_COMMUNITY)
Admission: RE | Admit: 2019-10-27 | Discharge: 2019-10-27 | Disposition: A | Discharge status: Home / Self Care | Payer: Self-pay | Source: Ambulatory Visit | Attending: Physician Assistant | Admitting: Physician Assistant

## 2019-10-27 ENCOUNTER — Other Ambulatory Visit: Payer: Self-pay

## 2019-10-27 ENCOUNTER — Ambulatory Visit: Payer: Self-pay | Admitting: Physician Assistant

## 2019-10-27 VITALS — BP 110/72 | HR 104 | Temp 97.7°F

## 2019-10-27 DIAGNOSIS — Z789 Other specified health status: Secondary | ICD-10-CM

## 2019-10-27 DIAGNOSIS — E785 Hyperlipidemia, unspecified: Secondary | ICD-10-CM | POA: Insufficient documentation

## 2019-10-27 DIAGNOSIS — N949 Unspecified condition associated with female genital organs and menstrual cycle: Secondary | ICD-10-CM | POA: Insufficient documentation

## 2019-10-27 LAB — COMPREHENSIVE METABOLIC PANEL
ALT: 23 U/L (ref 0–44)
AST: 24 U/L (ref 15–41)
Albumin: 4.2 g/dL (ref 3.5–5.0)
Alkaline Phosphatase: 131 U/L — ABNORMAL HIGH (ref 38–126)
Anion gap: 8 (ref 5–15)
BUN: 11 mg/dL (ref 6–20)
CO2: 30 mmol/L (ref 22–32)
Calcium: 9 mg/dL (ref 8.9–10.3)
Chloride: 101 mmol/L (ref 98–111)
Creatinine, Ser: 0.65 mg/dL (ref 0.44–1.00)
GFR calc Af Amer: >60 mL/min (ref >60)
GFR calc non Af Amer: >60 mL/min (ref >60)
Glucose, Bld: 109 mg/dL — ABNORMAL HIGH (ref 70–99)
Potassium: 3.8 mmol/L (ref 3.5–5.1)
Sodium: 139 mmol/L (ref 135–145)
Total Bilirubin: 0.7 mg/dL (ref 0.3–1.2)
Total Protein: 7.8 g/dL (ref 6.5–8.1)

## 2019-10-27 LAB — LIPID PANEL
Cholesterol: 222 mg/dL — ABNORMAL HIGH (ref 0–200)
HDL: 63 mg/dL (ref >40)
LDL Cholesterol: 131 mg/dL — ABNORMAL HIGH (ref 0–99)
Total CHOL/HDL Ratio: 3.5 RATIO
Triglycerides: 140 mg/dL (ref <150)
VLDL: 28 mg/dL (ref 0–40)

## 2019-10-27 NOTE — Progress Notes (Signed)
   BP 110/72   Pulse (!) 121   Temp 97.7 F (36.5 C)   LMP 06/16/2008   SpO2 99%    Subjective:    Patient ID: Heidi Gibson, female    DOB: Sep 20, 1965, 54 y.o.   MRN: [id number]  HPI: Heidi Gibson is a 54 y.o. female presenting on 10/27/2019 for Follow-up (since Sunday, 10-23-19. pt states she felt a bump and it popped and had watery blood. pt states she now feels like a cut pt states it burns when she wipes. pt states she is unable to see. pt states having some itching.)   HPI   Pt had a negative covid 19 screening questionnaire.   Pt says bump looked like a blister before she popped it.  She says she last had intercourse 20 days ago.      Relevant past medical, surgical, family and social history reviewed and updated as indicated. Interim medical history since our last visit reviewed. Allergies and medications reviewed and updated.  Review of Systems  Per HPI unless specifically indicated above     Objective:    BP 110/72   Pulse (!) 121   Temp 97.7 F (36.5 C)   LMP 06/16/2008   SpO2 99%   Wt Readings from Last 3 Encounters:  07/22/18 235 lb (106.6 kg)  07/06/18 232 lb 8 oz (105.5 kg)  03/02/18 246 lb (111.6 kg)    Physical Exam Constitutional:      General: She is not in acute distress.    Appearance: She is not ill-appearing.  HENT:     Head: Normocephalic and atraumatic.  Pulmonary:     Effort: No respiratory distress.  Genitourinary:    Comments: (nurse Berenice assistd)  There is an open wound at the anterior end of  The labia- no swelling or purulence.  Area is mildly tender.  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  No other lesions seen. Neurological:     Mental Status: She is alert and oriented to person, place, and time.  Psychiatric:        Attention and Perception: Attention normal.        Speech: Speech normal.        Behavior: Behavior is cooperative.           Assessment & Plan:    Encounter Diagnoses  Name Primary?  . Genital lesion, female Yes  . Not proficient in English language       -HSV test  -Counseled on HSV and gave reading information -discussed with pt that her lesion is SUSPECTED not confirmed due to it busted open and is just an open wound at this time -F/u 1 1/2 wk as scheduled

## 2019-10-27 NOTE — Patient Instructions (Signed)
Prueba del herpes simple Herpes Simplex Test Por qu me debo realizar esta prueba? La prueba del herpes simple se utiliza para detectar la presencia de una infeccin por el virus del herpes simple (VHS). Hay dos tipos comunes de VHS:  El tipo 1 (VHS1) con frecuencia provoca llagas en la boca o alrededor de ella y a veces en los ojos o alrededor de ellos.  El tipo 2 (VHS2) es una enfermedad de transmisin sexual que provoca llagas en los genitales o alrededor de ellos. Debe hacerse una prueba del VHS si:  El mdico sospecha que usted tiene o ha tenido una infeccin por VHS.  Tiene el sistema de defensa (sistema inmunitario) del cuerpo debilitado y presenta llagas que parecen erupciones de VHS alrededor de la boca o los genitales.  Tiene relaciones sexuales con muchas parejas sexuales, o su pareja tiene herpes genital.  Est embarazada, tiene herpes y espera tener un parto vaginal en las prximas 6 a 8 semanas. Qu se analiza? Hay dos tipos de pruebas para detectar el herpes simple:  Prueba de [place]. Esta prueba analiza la [place] para detectar: ? Anticuerpos contra el VHS. Los anticuerpos son protenas que produce el cuerpo para ayudar a combatir las infecciones. Esta prueba determina si hay anticuerpos contra el VHS en la sangre. ? Antgenos del VHS. Determina la presencia del virus del VHS (antgeno) en la sangre.  Prueba de cultivo. Esta prueba determina la presencia del virus en [place] de lquido de [place] llaga abierta. Las pruebas de cultivo [place] en [place], pero son muy precisas. Qu tipo de [place] se toma?     Las [employer] se extraen de acuerdo con el tipo de prueba que el mdico le haya indicado.  [place], [place] extrae [place] de sangre introduciendo una aguja en un vaso sanguneo.  Para una prueba de cultivo, la [place] se obtiene mediante el hisopado del lquido que sale de una llaga abierta durante una infeccin activa  (brote). Cmo se informan los resultados? Los [employer] se informarn como positivos o negativos. Para esta prueba, los resultados normales son:  Negativo para el virus del VHS o anticuerpos en la sangre.  Negativo para el virus del VHS en el cultivo de lquido. Algunas veces, los resultados de la prueba pueden informar que existe una afeccin cuando en realidad no es as (resultado positivo falso). Algunas veces, los resultados de la prueba pueden informar que no existe una afeccin cuando en realidad s la hay (resultado negativo falso). [place] significan los resultados?  Un resultado positivo puede indicar que tiene una infeccin activa por VHS. La presencia de anticuerpos o antgenos del VHS1 o VHS2 en la sangre indican una infeccin por el VHS activa.  Un resultado negativo significa que no tiene el virus del VHS1 o VHS2 en la sangre. Esto puede significar que usted no tiene una infeccin por el VHS. Hable con su mdico sobre lo que significan sus [place]. Preguntas para hacerle al mdico Consulte a su mdico o pregunte en el departamento donde se realiza la prueba acerca de lo siguiente:  Cundo estarn disponibles mis resultados?  Cmo obtendr mis resultados?  Cules son mis opciones de tratamiento?  Qu otras pruebas necesito?  Cules son los prximos pasos que debo seguir? Resumen  Puede someterse a esta prueba si su mdico sospecha que usted tiene una infeccin por el virus del herpes simple (VHS).  El tipo 1 (VHS1) con frecuencia provoca llagas en la boca o alrededor  de ella y a veces en los ojos o alrededor de ellos. El tipo 2 (VHS2) es una enfermedad de transmisin sexual que provoca llagas en los genitales o alrededor de ellos.  La prueba puede realizarse con [place] de [place] o [place] de lquido de una llaga abierta.  Un resultado positivo puede significar que tiene una infeccin por VHS activa. Un resultado negativo significa que es probable que no  tenga una infeccin [place]. Hable con su mdico sobre lo que significan sus [place]. Esta informacin no tiene Theme park manager el consejo del mdico. Asegrese de hacerle al mdico cualquier pregunta que tenga. Document Revised: 07/15/2017 Document Reviewed: 07/15/2017 Elsevier Patient Education  2020 [employer].

## 2019-10-28 LAB — HSV(HERPES SIMPLEX VRS) I + II AB-IGG
HSV 1 Glycoprotein G Ab, IgG: 39.5 index — ABNORMAL HIGH (ref 0.00–0.90)
HSV 2 Glycoprotein G Ab, IgG: 9.45 index — ABNORMAL HIGH (ref 0.00–0.90)

## 2019-10-29 LAB — HSV(HERPES SIMPLEX VRS) I + II AB-IGM: HSVI/II Comb IgM: <0.91 Ratio (ref 0.00–0.90)

## 2019-11-01 ENCOUNTER — Other Ambulatory Visit: Payer: Self-pay | Admitting: Physician Assistant

## 2019-11-01 MED ORDER — ACYCLOVIR 400 MG PO TABS: ORAL_TABLET | ORAL | 0 refills | Status: DC

## 2019-11-07 ENCOUNTER — Ambulatory Visit: Payer: Self-pay | Admitting: Physician Assistant

## 2019-11-07 ENCOUNTER — Other Ambulatory Visit: Payer: Self-pay

## 2019-11-07 ENCOUNTER — Encounter: Payer: Self-pay | Admitting: Physician Assistant

## 2019-11-07 VITALS — BP 111/71 | HR 96 | Temp 98.3°F | Wt 228.5 lb

## 2019-11-07 DIAGNOSIS — E785 Hyperlipidemia, unspecified: Secondary | ICD-10-CM

## 2019-11-07 DIAGNOSIS — B009 Herpesviral infection, unspecified: Secondary | ICD-10-CM

## 2019-11-07 DIAGNOSIS — Z789 Other specified health status: Secondary | ICD-10-CM

## 2019-11-07 MED ORDER — SIMVASTATIN 20 MG PO TABS: ORAL_TABLET | ORAL | 4 refills | Status: DC

## 2019-11-07 NOTE — Progress Notes (Signed)
BP 111/71   Pulse 96   Temp 98.3 F (36.8 C)   Wt 228 lb 8 oz (103.6 kg)   LMP 06/16/2008   SpO2 97%   BMI 40.48 kg/m    Subjective:    Patient ID: Heidi Gibson, female    DOB: Oct 10, 1965, 54 y.o.   MRN: [id number]  HPI: Heidi Gibson is a 54 y.o. female presenting on 11/07/2019 for Hyperlipidemia   HPI   Pt had a negative covid 19 screening questionnaire.    Pt is 54yoF who presents today for follow up on dyslipidemia.   She has no complaints today    Relevant past medical, surgical, family and social history reviewed and updated as indicated. Interim medical history since our last visit reviewed. Allergies and medications reviewed and updated.   Current Outpatient Medications:  .  acyclovir (ZOVIRAX) 400 MG tablet, 1 po tid x 10 days.  Tome una tableta por boca tres veces diarias por 10 dias., Disp: 30 tablet, Rfl: 0 .  simvastatin (ZOCOR) 20 MG tablet, TOME UNA TABLETA POR BOCA AL DORMIR., Disp: 30 tablet, Rfl: 4     Review of Systems  Per HPI unless specifically indicated above     Objective:    BP 111/71   Pulse 96   Temp 98.3 F (36.8 C)   Wt 228 lb 8 oz (103.6 kg)   LMP 06/16/2008   SpO2 97%   BMI 40.48 kg/m   Wt Readings from Last 3 Encounters:  11/07/19 228 lb 8 oz (103.6 kg)  07/22/18 235 lb (106.6 kg)  07/06/18 232 lb 8 oz (105.5 kg)    Physical Exam Vitals reviewed.  Constitutional:      Appearance: She is well-developed.  HENT:     Head: Normocephalic and atraumatic.  Cardiovascular:     Rate and Rhythm: Normal rate and regular rhythm.  Pulmonary:     Effort: Pulmonary effort is normal.     Breath sounds: Normal breath sounds.  Abdominal:     General: Bowel sounds are normal.     Palpations: Abdomen is soft. There is no mass.     Tenderness: There is no abdominal tenderness.  Musculoskeletal:     Cervical back: Neck supple.     Right lower leg: No edema.     Left lower leg: No edema.   Lymphadenopathy:     Cervical: No cervical adenopathy.  Skin:    General: Skin is warm and dry.  Neurological:     Mental Status: She is alert and oriented to person, place, and time.  Psychiatric:        Attention and Perception: Attention normal.        Speech: Speech normal.        Behavior: Behavior normal. Behavior is cooperative.     Results for orders placed or performed during the hospital encounter of 10/27/19  HSV(herpes simplex vrs) 1+2 ab-IgG  Result Value Ref Range   HSV 1 Glycoprotein G Ab, IgG 39.50 (H) 0.00 - 0.90 index   HSV 2 Glycoprotein G Ab, IgG 9.45 (H) 0.00 - 0.90 index  HSV(herpes simplex vrs) 1+2 ab-IgM  Result Value Ref Range   HSVI/II Comb IgM <0.91 0.00 - 0.90 Ratio  Lipid panel  Result Value Ref Range   Cholesterol 222 (H) 0 - 200 mg/dL   Triglycerides 956 <387 mg/dL   HDL 63 >56 mg/dL   Total CHOL/HDL Ratio 3.5 RATIO   VLDL  28 0 - 40 mg/dL   LDL Cholesterol 131 (H) 0 - 99 mg/dL  Comprehensive metabolic panel  Result Value Ref Range   Sodium 139 135 - 145 mmol/L   Potassium 3.8 3.5 - 5.1 mmol/L   Chloride 101 98 - 111 mmol/L   CO2 30 22 - 32 mmol/L   Glucose, Bld 109 (H) 70 - 99 mg/dL   BUN 11 6 - 20 mg/dL   Creatinine, Ser 0.65 0.44 - 1.00 mg/dL   Calcium 9.0 8.9 - 10.3 mg/dL   Total Protein 7.8 6.5 - 8.1 g/dL   Albumin 4.2 3.5 - 5.0 g/dL   AST 24 15 - 41 U/L   ALT 23 0 - 44 U/L   Alkaline Phosphatase 131 (H) 38 - 126 U/L   Total Bilirubin 0.7 0.3 - 1.2 mg/dL   GFR calc non Af Amer >60 >60 mL/min   GFR calc Af Amer >60 >60 mL/min   Anion gap 8 5 - 15      Assessment & Plan:    Encounter Diagnoses  Name Primary?  . Hyperlipidemia, unspecified hyperlipidemia type Yes  . Morbid obesity ([place])   . HSV infection   . Not proficient in [place] language      -reviewed labs with pt -pt counseled to [place].  She says sometimes she does which may be why her lipids are up and down -recommended she get STD testing (at [facility])  in light of + HSV test -pt to follow up  4 months.  She is to contact office sooner prn

## 2020-03-02 ENCOUNTER — Other Ambulatory Visit (HOSPITAL_COMMUNITY)
Admission: RE | Admit: 2020-03-02 | Discharge: 2020-03-02 | Disposition: A | Discharge status: Home / Self Care | Payer: Self-pay | Source: Ambulatory Visit | Attending: Physician Assistant | Admitting: Physician Assistant

## 2020-03-02 ENCOUNTER — Other Ambulatory Visit: Payer: Self-pay

## 2020-03-02 DIAGNOSIS — E785 Hyperlipidemia, unspecified: Secondary | ICD-10-CM | POA: Insufficient documentation

## 2020-03-02 LAB — COMPREHENSIVE METABOLIC PANEL
ALT: 15 U/L (ref 0–44)
AST: 17 U/L (ref 15–41)
Albumin: 4.2 g/dL (ref 3.5–5.0)
Alkaline Phosphatase: 79 U/L (ref 38–126)
Anion gap: 9 (ref 5–15)
BUN: 12 mg/dL (ref 6–20)
CO2: 27 mmol/L (ref 22–32)
Calcium: 9 mg/dL (ref 8.9–10.3)
Chloride: 104 mmol/L (ref 98–111)
Creatinine, Ser: 0.63 mg/dL (ref 0.44–1.00)
GFR calc Af Amer: >60 mL/min (ref >60)
GFR calc non Af Amer: >60 mL/min (ref >60)
Glucose, Bld: 101 mg/dL — ABNORMAL HIGH (ref 70–99)
Potassium: 3.8 mmol/L (ref 3.5–5.1)
Sodium: 140 mmol/L (ref 135–145)
Total Bilirubin: 0.7 mg/dL (ref 0.3–1.2)
Total Protein: 7.3 g/dL (ref 6.5–8.1)

## 2020-03-02 LAB — LIPID PANEL
Cholesterol: 168 mg/dL (ref 0–200)
HDL: 55 mg/dL (ref >40)
LDL Cholesterol: 100 mg/dL — ABNORMAL HIGH (ref 0–99)
Total CHOL/HDL Ratio: 3.1 RATIO
Triglycerides: 66 mg/dL (ref <150)
VLDL: 13 mg/dL (ref 0–40)

## 2020-03-05 ENCOUNTER — Encounter: Payer: Self-pay | Admitting: Physician Assistant

## 2020-03-05 ENCOUNTER — Other Ambulatory Visit: Payer: Self-pay

## 2020-03-05 ENCOUNTER — Other Ambulatory Visit: Payer: Self-pay | Admitting: Physician Assistant

## 2020-03-05 ENCOUNTER — Ambulatory Visit: Payer: Self-pay | Admitting: Physician Assistant

## 2020-03-05 VITALS — BP 110/72 | HR 94 | Temp 97.6°F | Ht 63.25 in | Wt 228.0 lb

## 2020-03-05 DIAGNOSIS — Z1211 Encounter for screening for malignant neoplasm of colon: Secondary | ICD-10-CM

## 2020-03-05 DIAGNOSIS — Z789 Other specified health status: Secondary | ICD-10-CM

## 2020-03-05 DIAGNOSIS — E785 Hyperlipidemia, unspecified: Secondary | ICD-10-CM

## 2020-03-05 MED ORDER — SIMVASTATIN 20 MG PO TABS: ORAL_TABLET | ORAL | 6 refills | Status: DC

## 2020-03-05 NOTE — Progress Notes (Signed)
BP 110/72   Pulse 94   Temp 97.6 F (36.4 C)   Ht 5' 3.25" (1.607 m)   Wt 228 lb (103.4 kg)   LMP 06/16/2008   SpO2 98%   BMI 40.07 kg/m    Subjective:    Patient ID: Heidi Gibson, female    DOB: 1966-03-08, 54 y.o.   MRN: [id number]  HPI: Heidi Gibson is a 54 y.o. female presenting on 03/05/2020 for Hyperlipidemia   HPI   Pt had a negative covid 19 screening questionnaire.    Pt is 54yoF with appointment to follow up dyslipidemia.  She says she is doing well and has no complaints.  She is watching what she eats and is exercising regularly.  She has lost some weight and it feels good.  She is planning to lose a little bit more.      Relevant past medical, surgical, family and social history reviewed and updated as indicated. Interim medical history since our last visit reviewed. Allergies and medications reviewed and updated.  CURRENT MEDS: Simvastatin 20mg  PO qd    Review of Systems  Per HPI unless specifically indicated above     Objective:    BP 110/72   Pulse 94   Temp 97.6 F (36.4 C)   Ht 5' 3.25" (1.607 m)   Wt 228 lb (103.4 kg)   LMP 06/16/2008   SpO2 98%   BMI 40.07 kg/m   Wt Readings from Last 3 Encounters:  03/05/20 228 lb (103.4 kg)  11/07/19 228 lb 8 oz (103.6 kg)  07/22/18 235 lb (106.6 kg)    Physical Exam Vitals reviewed.  Constitutional:      General: She is not in acute distress.    Appearance: She is well-developed. She is not ill-appearing.  HENT:     Head: Normocephalic and atraumatic.  Cardiovascular:     Rate and Rhythm: Normal rate and regular rhythm.  Pulmonary:     Effort: Pulmonary effort is normal.     Breath sounds: Normal breath sounds.  Abdominal:     General: Bowel sounds are normal.     Palpations: Abdomen is soft. There is no mass.     Tenderness: There is no abdominal tenderness.  Musculoskeletal:     Cervical back: Neck supple.     Right lower leg: No edema.     Left  lower leg: No edema.  Lymphadenopathy:     Cervical: No cervical adenopathy.  Skin:    General: Skin is warm and dry.  Neurological:     Mental Status: She is alert and oriented to person, place, and time.  Psychiatric:        Behavior: Behavior normal.     Results for orders placed or performed during the hospital encounter of 03/02/20  Lipid panel  Result Value Ref Range   Cholesterol 168 0 - 200 mg/dL   Triglycerides 66 03/04/20 mg/dL   HDL 55 <149 mg/dL   Total CHOL/HDL Ratio 3.1 RATIO   VLDL 13 0 - 40 mg/dL   LDL Cholesterol >70 (H) 0 - 99 mg/dL  Comprehensive metabolic panel  Result Value Ref Range   Sodium 140 135 - 145 mmol/L   Potassium 3.8 3.5 - 5.1 mmol/L   Chloride 104 98 - 111 mmol/L   CO2 27 22 - 32 mmol/L   Glucose, Bld 101 (H) 70 - 99 mg/dL   BUN 12 6 - 20 mg/dL   Creatinine, Ser  0.63 0.44 - 1.00 mg/dL   Calcium 9.0 8.9 - 62.5 mg/dL   Total Protein 7.3 6.5 - 8.1 g/dL   Albumin 4.2 3.5 - 5.0 g/dL   AST 17 15 - 41 U/L   ALT 15 0 - 44 U/L   Alkaline Phosphatase 79 38 - 126 U/L   Total Bilirubin 0.7 0.3 - 1.2 mg/dL   GFR calc non Af Amer >60 >60 mL/min   GFR calc Af Amer >60 >60 mL/min   Anion gap 9 5 - 15      Assessment & Plan:    Encounter Diagnoses  Name Primary?  . Hyperlipidemia, unspecified hyperlipidemia type Yes  . Morbid obesity (H[facility])   . Not proficient in [place] language   . Screening for colon cancer      -reviewed labs with pt -pt to continue current medications and healthy diet with regular exercise -she was given ifobt for colon cancer screening -pt to follow up 4 months.  She is to contact office sooner prn

## 2020-07-05 ENCOUNTER — Ambulatory Visit: Payer: Self-pay | Admitting: Physician Assistant

## 2020-07-12 ENCOUNTER — Other Ambulatory Visit (HOSPITAL_COMMUNITY)
Admission: RE | Admit: 2020-07-12 | Discharge: 2020-07-12 | Disposition: A | Discharge status: Home / Self Care | Payer: Self-pay | Source: Ambulatory Visit | Attending: Physician Assistant | Admitting: Physician Assistant

## 2020-07-12 ENCOUNTER — Other Ambulatory Visit: Payer: Self-pay

## 2020-07-12 ENCOUNTER — Ambulatory Visit: Payer: Self-pay | Admitting: Physician Assistant

## 2020-07-12 ENCOUNTER — Encounter: Payer: Self-pay | Admitting: Physician Assistant

## 2020-07-12 DIAGNOSIS — E785 Hyperlipidemia, unspecified: Secondary | ICD-10-CM

## 2020-07-12 DIAGNOSIS — M79604 Pain in right leg: Secondary | ICD-10-CM

## 2020-07-12 DIAGNOSIS — Z789 Other specified health status: Secondary | ICD-10-CM

## 2020-07-12 LAB — COMPREHENSIVE METABOLIC PANEL
ALT: 16 U/L (ref 0–44)
AST: 20 U/L (ref 15–41)
Albumin: 4 g/dL (ref 3.5–5.0)
Alkaline Phosphatase: 102 U/L (ref 38–126)
Anion gap: 7 (ref 5–15)
BUN: 11 mg/dL (ref 6–20)
CO2: 28 mmol/L (ref 22–32)
Calcium: 8.9 mg/dL (ref 8.9–10.3)
Chloride: 103 mmol/L (ref 98–111)
Creatinine, Ser: 0.6 mg/dL (ref 0.44–1.00)
GFR, Estimated: >60 mL/min (ref >60)
Glucose, Bld: 97 mg/dL (ref 70–99)
Potassium: 3.6 mmol/L (ref 3.5–5.1)
Sodium: 138 mmol/L (ref 135–145)
Total Bilirubin: 0.7 mg/dL (ref 0.3–1.2)
Total Protein: 7.6 g/dL (ref 6.5–8.1)

## 2020-07-12 LAB — LIPID PANEL
Cholesterol: 172 mg/dL (ref 0–200)
HDL: 63 mg/dL (ref >40)
LDL Cholesterol: 97 mg/dL (ref 0–99)
Total CHOL/HDL Ratio: 2.7 RATIO
Triglycerides: 60 mg/dL (ref <150)
VLDL: 12 mg/dL (ref 0–40)

## 2020-07-12 MED ORDER — SIMVASTATIN 20 MG PO TABS: ORAL_TABLET | ORAL | 6 refills | Status: DC

## 2020-07-12 NOTE — Progress Notes (Signed)
LMP 06/16/2008    Subjective:    Patient ID: Heidi Gibson, female    DOB: September 09, 1965, 55 y.o.   MRN: [id number]  HPI: Heidi Gibson is a 55 y.o. female presenting on 07/12/2020 for No chief complaint on file.   HPI   This is a telemedicine appointment due to coronavirus pandemic.  It is via telephone as pt was unable to get connected through [employer].  I connected with  Heidi Gibson on 07/12/20 by a video enabled telemedicine application and verified that I am speaking with the correct person using two identifiers.   I discussed the limitations of evaluation and management by telemedicine. The patient expressed understanding and agreed to proceed.  Pt is at home.  Provider and translator are at office.    Pt is 54yoF with dyslipidemia and appointment for routine follow up.    Pt complains of Pain in legs lately.  It comes and goes.  Some days good and some days it hurts. She works doing Designer, fashion/clothing; she stands all day in one spot.  She says her variscose veins hurt.  This has been evaluated with her in the office on previous occassions.      Relevant past medical, surgical, family and social history reviewed and updated as indicated. Interim medical history since our last visit reviewed. Allergies and medications reviewed and updated.   Current Outpatient Medications:  .  acetaminophen (TYLENOL) 500 MG tablet, Take 500 mg by mouth every 8 (eight) hours as needed., Disp: , Rfl:  .  simvastatin (ZOCOR) 20 MG tablet, TOME UNA TABLETA POR BOCA AL DORMIR., Disp: 30 tablet, Rfl: 6    Review of Systems  Per HPI unless specifically indicated above     Objective:    LMP 06/16/2008   Wt Readings from Last 3 Encounters:  03/05/20 228 lb (103.4 kg)  11/07/19 228 lb 8 oz (103.6 kg)  07/22/18 235 lb (106.6 kg)    Physical Exam Pulmonary:     Effort: No respiratory distress.     Comments: Pt is talking in  complete sentences without dyspnea.  Neurological:     Mental Status: She is alert and oriented to person, place, and time.  Psychiatric:        Attention and Perception: Attention normal.        Speech: Speech normal.        Behavior: Behavior is cooperative.     Results for orders placed or performed during the hospital encounter of 07/12/20  Lipid panel  Result Value Ref Range   Cholesterol 172 0 - 200 mg/dL   Triglycerides 60 <440 mg/dL   HDL 63 >10 mg/dL   Total CHOL/HDL Ratio 2.7 RATIO   VLDL 12 0 - 40 mg/dL   LDL Cholesterol 97 0 - 99 mg/dL  Comprehensive metabolic panel  Result Value Ref Range   Sodium 138 135 - 145 mmol/L   Potassium 3.6 3.5 - 5.1 mmol/L   Chloride 103 98 - 111 mmol/L   CO2 28 22 - 32 mmol/L   Glucose, Bld 97 70 - 99 mg/dL   BUN 11 6 - 20 mg/dL   Creatinine, Ser 2.72 0.44 - 1.00 mg/dL   Calcium 8.9 8.9 - 53.6 mg/dL   Total Protein 7.6 6.5 - 8.1 g/dL   Albumin 4.0 3.5 - 5.0 g/dL   AST 20 15 - 41 U/L   ALT 16 0 - 44 U/L  Alkaline Phosphatase 102 38 - 126 U/L   Total Bilirubin 0.7 0.3 - 1.2 mg/dL   GFR, Estimated >96 >43 mL/min   Anion gap 7 5 - 15      Assessment & Plan:    Encounter Diagnoses  Name Primary?  . Hyperlipidemia, unspecified hyperlipidemia type Yes  . Not proficient in [place] language   . Pain in both lower extremities     -Reviewed labs with pt -pt to Continue simvastatin -will refer for annual screening Mammogram -recommended pt get covid Booster -pt is reminded to Return ifobt that she was given in September for colon cancer screening -will Update pap at next appointment -recommended pt get Inserts for shoes.  Recommended Good supportive shoes.  Exercise / walking regularly.    Ice/heat to legs after work.  She Can try aleve prn or use apap.  Reminded pt of importance of maintaining healthy weight to help the legs -pt to follow up 4 months.  She is to contact office sooner prn

## 2020-07-17 ENCOUNTER — Other Ambulatory Visit: Payer: Self-pay | Admitting: Student

## 2020-07-17 ENCOUNTER — Other Ambulatory Visit: Payer: Self-pay | Admitting: Physician Assistant

## 2020-07-17 DIAGNOSIS — Z1211 Encounter for screening for malignant neoplasm of colon: Secondary | ICD-10-CM

## 2020-07-17 DIAGNOSIS — Z1239 Encounter for other screening for malignant neoplasm of breast: Secondary | ICD-10-CM

## 2020-07-17 LAB — IFOBT (OCCULT BLOOD): IFOBT: NEGATIVE

## 2020-07-25 ENCOUNTER — Ambulatory Visit (HOSPITAL_COMMUNITY)
Admission: RE | Admit: 2020-07-25 | Discharge: 2020-07-25 | Disposition: A | Discharge status: Home / Self Care | Payer: Self-pay | Source: Ambulatory Visit | Attending: Physician Assistant | Admitting: Physician Assistant

## 2020-07-25 ENCOUNTER — Other Ambulatory Visit: Payer: Self-pay

## 2020-07-25 DIAGNOSIS — Z1239 Encounter for other screening for malignant neoplasm of breast: Secondary | ICD-10-CM | POA: Insufficient documentation

## 2020-08-21 ENCOUNTER — Encounter: Payer: Self-pay | Admitting: Physician Assistant

## 2020-08-21 ENCOUNTER — Ambulatory Visit: Payer: Self-pay | Admitting: Physician Assistant

## 2020-08-21 ENCOUNTER — Other Ambulatory Visit: Payer: Self-pay

## 2020-08-21 VITALS — BP 115/79 | HR 107 | Temp 97.0°F | Wt 221.0 lb

## 2020-08-21 DIAGNOSIS — N3090 Cystitis, unspecified without hematuria: Secondary | ICD-10-CM

## 2020-08-21 DIAGNOSIS — Z789 Other specified health status: Secondary | ICD-10-CM

## 2020-08-21 MED ORDER — CEPHALEXIN 500 MG PO CAPS: ORAL_CAPSULE | ORAL | 0 refills | Status: DC

## 2020-08-21 NOTE — Progress Notes (Signed)
   BP 115/79   Pulse (!) 107   Temp (!) 97 F (36.1 C)   Wt 221 lb (100.2 kg)   LMP 06/16/2008   SpO2 98%   BMI 38.84 kg/m    Subjective:    Patient ID: Heidi Gibson, female    DOB: 1966-03-25, 55 y.o.   MRN: [id number]  HPI: Heidi Gibson is a 55 y.o. female presenting on 08/21/2020 for Urinary Urgency   HPI   Pt had a negative covid 19 screening questionnaire.   Pt c/o Urinary pain and burning x 2 days.  No fever.  She has been taking otc azo.  She has had UTI in the past.     Relevant past medical, surgical, family and social history reviewed and updated as indicated. Interim medical history since our last visit reviewed. Allergies and medications reviewed and updated.   Current Outpatient Medications:  .  acetaminophen (TYLENOL) 500 MG tablet, Take 500 mg by mouth every 8 (eight) hours as needed., Disp: , Rfl:  .  PHENAZOPYRIDINE HCL PO, Take by mouth., Disp: , Rfl:  .  simvastatin (ZOCOR) 20 MG tablet, TOME UNA TABLETA POR BOCA AL DORMIR., Disp: 30 tablet, Rfl: 6    Review of Systems  Per HPI unless specifically indicated above     Objective:    BP 115/79   Pulse (!) 107   Temp (!) 97 F (36.1 C)   Wt 221 lb (100.2 kg)   LMP 06/16/2008   SpO2 98%   BMI 38.84 kg/m   Wt Readings from Last 3 Encounters:  08/21/20 221 lb (100.2 kg)  03/05/20 228 lb (103.4 kg)  11/07/19 228 lb 8 oz (103.6 kg)    Physical Exam Constitutional:      General: She is not in acute distress.    Appearance: She is obese. She is not toxic-appearing.  HENT:     Head: Normocephalic and atraumatic.  Pulmonary:     Effort: Pulmonary effort is normal. No respiratory distress.  Neurological:     Mental Status: She is alert and oriented to person, place, and time.  Psychiatric:        Attention and Perception: Attention normal.        Speech: Speech normal.        Behavior: Behavior normal. Behavior is cooperative.      UA not done due to  urine red due to recent AZO        Assessment & Plan:    Encounter Diagnoses  Name Primary?  . Cystitis Yes  . Not proficient in English language       -rx keflex.  Pt counseled to drink plenty of water, limit sodas.  She is given reading information on UTI.  -F/u May as scheduled.  Contact office sooner prn

## 2020-08-21 NOTE — Patient Instructions (Signed)
Infeccin urinaria en los adultos Urinary Tract Infection, Adult  Una infeccin urinaria (IU) puede ocurrir en Corporate treasurer de las vas [place]. Las vas urinarias incluyen a los riones, los urteres, la vejiga y Engineer, mining. Estos rganos fabrican, Barrister's clerk y eliminan la orina del organismo. La IU alta afecta los urteres y los riones. La IU baja afecta la vejiga y Engineer, mining. Cules son las causas? La mayora de las infecciones de las vas urinarias es causada por la presencia de bacterias en la zona genital, alrededor de la uretra, por donde sale la orina del cuerpo. Estas bacterias proliferan y causan inflamacin en las vas urinarias. Qu incrementa el riesgo? Es ms probable que sufra esta afeccin si:  Tiene colocado un catter [employer].  No puede controlar cundo Automotive engineer (incontinencia).  Es mujer y usted: ? [place] espermicida o diafragma como mtodo anticonceptivo. ? Tiene niveles bajos de estrgenos. ? Est embarazada.  Tiene ciertos genes que [employer].  Es sexualmente activa.  Toma antibiticos.  Tiene una afeccin que causa que el flujo de orina sea [place], como: ? Prstata agrandada, si usted es hombre. ? Obstruccin de Engineer, mining. ? Clculo renal. ? Una afeccin nerviosa que afecta el control de la vejiga (vejiga neurgena). ? No bebe lo suficiente o no orina con frecuencia.  Tiene ciertas enfermedades crnicas, como: ? Diabetes. ? Un sistema que combate las enfermedades (sistemainmunitario) debilitado. ? Anemia drepanoctica. ? Gota. ? Lesin en la mdula espinal. Cules son los signos o sntomas? Los sntomas de esta afeccin incluyen:  Necesidad inmediata (urgencia) de Geographical information systems officer.  Miccin frecuente. Esto puede incluir pequeas cantidades de [place] cada vez que [place].  Ardor o dolor al [employer].  Presencia de [employer].  Orina con mal olor u [employer] atpico.  Dificultad para Geographical information systems officer.  Mason Jim turbia.  Secrecin  vaginal, si es mujer.  Dolor en el abdomen o en la parte inferior de la espalda. Es posible que tambin tenga:  Vmitos o disminucin del apetito.  Confusin.  Irritabilidad o cansancio.  Fiebre o escalofros.  Diarrea. El Engineer, maintenance sntoma en los adultos mayores puede ser la confusin. En algunos casos, es posible que no tengan sntomas hasta que la infeccin empeore. Cmo se diagnostica? Esta afeccin se diagnostica en funcin de sus antecedentes mdicos y de un examen fsico. Tambin pueden hacerle otras pruebas, incluidas las siguientes:  Anlisis de [place].  Anlisis de [place].  Pruebas de infecciones de transmisin sexual (ITS). Si ha tenido ms de una infeccin urinaria (IU), se pueden hacer estudios de diagnstico por imgenes o una cistoscopia para determinar la causa de las infecciones. Cmo se trata? El tratamiento de esta afeccin incluye lo siguiente:  Antibiticos.  Medicamentos de venta libre para [employer].  Beber una cantidad suficiente agua para mantenerse hidratado. Si tiene infecciones con frecuencia o tiene otras afecciones, como un clculo renal, es posible que deba ver a un mdico especialista en las vas urinarias (urlogo). En casos poco frecuentes, las infecciones urinarias pueden provocar sepsis. La sepsis es una afeccin potencialmente mortal que se produce cuando el cuerpo responde a una infeccin. La sepsis se trata en el hospital con antibiticos, lquidos y otros medicamentos que se administran por va intravenosa. Siga estas instrucciones en su casa: Medicamentos  Use los medicamentos de venta libre y los recetados solamente como se lo haya indicado el mdico.  Si le recetaron un antibitico, tmelo como se lo haya indicado el mdico. No deje de Network engineer  antibitico aunque comience a sentirse mejor. Instrucciones generales  Asegrese de hacer lo siguiente: ? Vaciar la vejiga con frecuencia y en su totalidad. No contener la orina  durante largos perodos. ? Vaciar la vejiga despus de [employer]. ? Limpiarse de atrs hacia adelante despus de orinar o defecar, si es mujer. Usar cada trozo de papel higinico solo una vez cuando se limpie.  Beber suficiente lquido como para Pharmacologist la orina de color amarillo plido.  Cumpla con todas las visitas de seguimiento. Esto es importante.   Comunquese con un mdico si:  Los sntomas no mejoran despus de 1 o 2das de tratamiento.  Los sntomas desaparecen y luego vuelven a Research officer, trade union. Solicite ayuda de inmediato si:  Siente dolor intenso en la espalda o en la parte inferior del abdomen.  Tiene fiebre o escalofros.  Tiene nuseas o vmitos. Resumen  Una infeccin urinaria (IU) es una infeccin en cualquier parte de las vas urinarias, que [employer] riones, los urteres, la vejiga y Engineer, mining.  La mayora de las infecciones de las vas urinarias es causada por bacterias en la zona genital.  El tratamiento de esta afeccin suele incluir antibiticos.  Si le recetaron un antibitico, tmelo como se lo haya indicado el mdico. No deje de usar el antibitico aunque comience a Actor.  Cumpla con todas las visitas de seguimiento. Esto es importante. Esta informacin no tiene Theme park manager el consejo del mdico. Asegrese de hacerle al mdico cualquier pregunta que tenga. Document Revised: 03/26/2020 Document Reviewed: 03/26/2020 Elsevier Patient Education  2021 [employer].

## 2020-08-30 ENCOUNTER — Emergency Department (HOSPITAL_COMMUNITY)
Admission: EM | Admit: 2020-08-30 | Discharge: 2020-08-30 | Disposition: A | Discharge status: Home / Self Care | Payer: Self-pay | Attending: Emergency Medicine | Admitting: Emergency Medicine

## 2020-08-30 ENCOUNTER — Encounter (HOSPITAL_COMMUNITY): Payer: Self-pay | Admitting: Emergency Medicine

## 2020-08-30 ENCOUNTER — Other Ambulatory Visit: Payer: Self-pay

## 2020-08-30 DIAGNOSIS — M5441 Lumbago with sciatica, right side: Secondary | ICD-10-CM | POA: Insufficient documentation

## 2020-08-30 DIAGNOSIS — M5431 Sciatica, right side: Secondary | ICD-10-CM

## 2020-08-30 MED ORDER — OXYCODONE-ACETAMINOPHEN 5-325 MG PO TABS
1.0000 | ORAL_TABLET | Freq: Once | ORAL | Status: AC
  Administered 2020-08-30: 1 via ORAL
  Filled 2020-08-30: qty 1

## 2020-08-30 MED ORDER — DEXAMETHASONE SODIUM PHOSPHATE 10 MG/ML IJ SOLN
10.0000 mg | Freq: Once | INTRAMUSCULAR | Status: AC
  Administered 2020-08-30: 10 mg via INTRAMUSCULAR
  Filled 2020-08-30: qty 1

## 2020-08-30 MED ORDER — HYDROCODONE-ACETAMINOPHEN 5-325 MG PO TABS: 1.0000 | ORAL_TABLET | ORAL | 0 refills | Status: DC | PRN

## 2020-08-30 MED ORDER — METHOCARBAMOL 500 MG PO TABS: 500.0000 mg | ORAL_TABLET | Freq: Three times a day (TID) | ORAL | 0 refills | Status: DC | PRN

## 2020-08-30 MED ORDER — KETOROLAC TROMETHAMINE 60 MG/2ML IM SOLN
60.0000 mg | Freq: Once | INTRAMUSCULAR | Status: AC
  Administered 2020-08-30: 60 mg via INTRAMUSCULAR
  Filled 2020-08-30: qty 2

## 2020-08-30 MED ORDER — IBUPROFEN 800 MG PO TABS
800.0000 mg | ORAL_TABLET | Freq: Four times a day (QID) | ORAL | 0 refills | Status: DC | PRN
Start: 2020-08-30 — End: 2020-11-06

## 2020-08-30 NOTE — ED Triage Notes (Signed)
Pt c/o lower back pain since yesterday. Pt states she was diagnosed with uti last week.

## 2020-08-30 NOTE — ED Provider Notes (Signed)
[facility] EMERGENCY DEPARTMENT Provider Note   CSN: [id number] Arrival date & time: 08/30/20  3220     History Chief Complaint  Patient presents with  . Back Pain    Heidi Gibson Heidi Gibson is a 55 y.o. female.  Patient presents to the emergency department for evaluation of back pain.  Patient reports that the pain started yesterday.  Pain is in the middle of the lower back and radiates to the right thigh.  Patient reports significant worsening of the pain with movement of her back.  She denies any injury.  Patient does report that she had a similar problem several years ago and was seen in the ER for it.        Past Medical History:  Diagnosis Date  . Hyperlipidemia     Patient Active Problem List   Diagnosis Date Noted  . Hyperlipidemia 08/27/2015  . Morbid obesity (HCC) 08/27/2015    Past Surgical History:  Procedure Laterality Date  . CESAREAN SECTION    . TUBAL LIGATION       OB History   No obstetric history on file.     Family History  Problem Relation Age of Onset  . Heart disease Father     Social History   Tobacco Use  . Smoking status: Never Smoker  . Smokeless tobacco: Never Used  Substance Use Topics  . Alcohol use: No  . Drug use: No    Home Medications Prior to Admission medications   Medication Sig Start Date End Date Taking? Authorizing Provider  HYDROcodone-acetaminophen (NORCO/VICODIN) 5-325 MG tablet Take 1 tablet by mouth every 4 (four) hours as needed for moderate pain. 08/30/20  Yes Terisha Losasso, Canary Brim, MD  ibuprofen (ADVIL) 800 MG tablet Take 1 tablet (800 mg total) by mouth every 6 (six) hours as needed for moderate pain. 08/30/20  Yes Geronimo Diliberto, Canary Brim, MD  methocarbamol (ROBAXIN) 500 MG tablet Take 1 tablet (500 mg total) by mouth every 8 (eight) hours as needed for muscle spasms. 08/30/20  Yes Jessamine Barcia, Canary Brim, MD  acetaminophen (TYLENOL) 500 MG tablet Take 500 mg by mouth every 8 (eight) hours as  needed.    [provider]  cephALEXin (KEFLEX) 500 MG capsule 1 po qid x 7 d.  Tome una tableta por boca 4 veces diarias por 7 dias 08/21/20   Jacquelin Hawking, PA-C  PHENAZOPYRIDINE HCL PO Take by mouth.    [provider]  simvastatin (ZOCOR) 20 MG tablet TOME UNA TABLETA POR BOCA AL DORMIR. 07/12/20   Jacquelin Hawking, PA-C    Allergies    Patient has no known allergies.  Review of Systems   Review of Systems  Musculoskeletal: Positive for back pain.  All other systems reviewed and are negative.   Physical Exam Updated Vital Signs BP (!) 141/84   Pulse 99   Temp 98.6 F (37 C) (Oral)   Resp 20   Ht 5\' 6"  (1.676 m)   Wt 99.8 kg   LMP 06/16/2008   SpO2 99%   BMI 35.51 kg/m   Physical Exam Vitals and nursing note reviewed.  Constitutional:      General: She is not in acute distress.    Appearance: Normal appearance. She is well-developed.  HENT:     Head: Normocephalic and atraumatic.     Right Ear: Hearing normal.     Left Ear: Hearing normal.     Nose: Nose normal.  Eyes:     Conjunctiva/sclera: Conjunctivae normal.  Pupils: Pupils are equal, round, and reactive to light.  Cardiovascular:     Rate and Rhythm: Regular rhythm.     Heart sounds: S1 normal and S2 normal. No murmur heard. No friction rub. No gallop.   Pulmonary:     Effort: Pulmonary effort is normal. No respiratory distress.     Breath sounds: Normal breath sounds.  Chest:     Chest wall: No tenderness.  Abdominal:     General: Bowel sounds are normal.     Palpations: Abdomen is soft.     Tenderness: There is no abdominal tenderness. There is no guarding or rebound. Negative signs include Murphy's sign and McBurney's sign.     Hernia: No hernia is present.  Musculoskeletal:     Cervical back: Normal, normal range of motion and neck supple.     Thoracic back: Normal.     Lumbar back: Tenderness and bony tenderness present. Decreased range of motion. Positive right straight  leg raise test.       Back:  Skin:    General: Skin is warm and dry.     Findings: No rash.  Neurological:     Mental Status: She is alert and oriented to person, place, and time.     GCS: GCS eye subscore is 4. GCS verbal subscore is 5. GCS motor subscore is 6.     Cranial Nerves: No cranial nerve deficit.     Sensory: No sensory deficit.     Coordination: Coordination normal.  Psychiatric:        Speech: Speech normal.        Behavior: Behavior normal.        Thought Content: Thought content normal.     ED Results / Procedures / Treatments   Labs (all labs ordered are listed, but only abnormal results are displayed) Labs Reviewed - No data to display  EKG None  Radiology No results found.  Procedures Procedures   Medications Ordered in ED Medications  ketorolac (TORADOL) injection 60 mg (has no administration in time range)  oxyCODONE-acetaminophen (PERCOCET/ROXICET) 5-325 MG per tablet 1 tablet (has no administration in time range)  dexamethasone (DECADRON) injection 10 mg (has no administration in time range)    ED Course  I have reviewed the triage vital signs and the nursing notes.  Pertinent labs & imaging results that were available during my care of the patient were reviewed by me and considered in my medical decision making (see chart for details).    MDM Rules/Calculators/A&P                          Patient presents to the ER with musculoskeletal back pain. Examination reveals back tenderness without any associated neurologic findings. Patient's strength, sensation and reflexes were normal. There is no evidence of saddle anesthesia. Patient does not have a foot drop. Patient has not experienced any change in bowel or bladder function. As such, patient did not require any imaging or further studies. Patient was treated with analgesia. Final Clinical Impression(s) / ED Diagnoses Final diagnoses:  Sciatica of right side    Rx / DC Orders ED Discharge  Orders         Ordered    HYDROcodone-acetaminophen (NORCO/VICODIN) 5-325 MG tablet  Every 4 hours PRN        08/30/20 0644    methocarbamol (ROBAXIN) 500 MG tablet  Every 8 hours PRN        08/30/20 0644  ibuprofen (ADVIL) 800 MG tablet  Every 6 hours PRN        08/30/20 0644           Gilda Crease, MD 08/30/20 [id number]

## 2020-10-25 ENCOUNTER — Other Ambulatory Visit: Payer: Self-pay | Admitting: Physician Assistant

## 2020-10-25 DIAGNOSIS — E785 Hyperlipidemia, unspecified: Secondary | ICD-10-CM

## 2020-11-01 ENCOUNTER — Other Ambulatory Visit (HOSPITAL_COMMUNITY)
Admission: RE | Admit: 2020-11-01 | Discharge: 2020-11-01 | Disposition: A | Discharge status: Home / Self Care | Payer: Self-pay | Source: Ambulatory Visit | Attending: Physician Assistant | Admitting: Physician Assistant

## 2020-11-01 DIAGNOSIS — E785 Hyperlipidemia, unspecified: Secondary | ICD-10-CM | POA: Insufficient documentation

## 2020-11-01 LAB — LIPID PANEL
Cholesterol: 194 mg/dL (ref 0–200)
HDL: 54 mg/dL (ref >40)
LDL Cholesterol: 106 mg/dL — ABNORMAL HIGH (ref 0–99)
Total CHOL/HDL Ratio: 3.6 RATIO
Triglycerides: 168 mg/dL — ABNORMAL HIGH (ref <150)
VLDL: 34 mg/dL (ref 0–40)

## 2020-11-01 LAB — HEPATIC FUNCTION PANEL
ALT: 22 U/L (ref 0–44)
AST: 23 U/L (ref 15–41)
Albumin: 3.9 g/dL (ref 3.5–5.0)
Alkaline Phosphatase: 104 U/L (ref 38–126)
Bilirubin, Direct: <0.1 mg/dL (ref 0.0–0.2)
Total Bilirubin: 0.6 mg/dL (ref 0.3–1.2)
Total Protein: 7.2 g/dL (ref 6.5–8.1)

## 2020-11-06 ENCOUNTER — Other Ambulatory Visit: Payer: Self-pay

## 2020-11-06 ENCOUNTER — Other Ambulatory Visit (HOSPITAL_COMMUNITY)
Admission: RE | Admit: 2020-11-06 | Discharge: 2020-11-06 | Disposition: A | Discharge status: Home / Self Care | Payer: Self-pay | Source: Ambulatory Visit | Attending: Physician Assistant | Admitting: Physician Assistant

## 2020-11-06 ENCOUNTER — Ambulatory Visit: Payer: Self-pay | Admitting: Physician Assistant

## 2020-11-06 ENCOUNTER — Encounter: Payer: Self-pay | Admitting: Physician Assistant

## 2020-11-06 VITALS — BP 118/78 | HR 91 | Temp 96.5°F | Wt 229.0 lb

## 2020-11-06 DIAGNOSIS — Z124 Encounter for screening for malignant neoplasm of cervix: Secondary | ICD-10-CM | POA: Insufficient documentation

## 2020-11-06 DIAGNOSIS — E669 Obesity, unspecified: Secondary | ICD-10-CM

## 2020-11-06 DIAGNOSIS — Z789 Other specified health status: Secondary | ICD-10-CM

## 2020-11-06 DIAGNOSIS — E785 Hyperlipidemia, unspecified: Secondary | ICD-10-CM

## 2020-11-06 MED ORDER — SIMVASTATIN 20 MG PO TABS: ORAL_TABLET | ORAL | 6 refills | Status: DC

## 2020-11-06 MED ORDER — IBUPROFEN 800 MG PO TABS: 800.0000 mg | ORAL_TABLET | Freq: Three times a day (TID) | ORAL | 1 refills | Status: DC | PRN

## 2020-11-06 NOTE — Progress Notes (Signed)
BP 118/78   Pulse 91   Temp (!) 96.5 F (35.8 C)   Wt 229 lb (103.9 kg)   LMP 06/16/2008   SpO2 99%   BMI 36.96 kg/m    Subjective:    Patient ID: Heidi Gibson, female    DOB: 10/19/1965, 55 y.o.   MRN: [id number]  HPI: Heidi Gibson is a 55 y.o. female presenting on 11/06/2020 for No chief complaint on file.   HPI   Pt had a negative covid 19 screening questionnaire.   Pt is 55yoF who presents for routine PAP and follow up dyslipidemia.  She is seeing a chiropractor for her back  She has no complaints today   Relevant past medical, surgical, family and social history reviewed and updated as indicated. Interim medical history since our last visit reviewed. Allergies and medications reviewed and updated.    Review of Systems  Per HPI unless specifically indicated above     Objective:    BP 118/78   Pulse 91   Temp (!) 96.5 F (35.8 C)   Wt 229 lb (103.9 kg)   LMP 06/16/2008   SpO2 99%   BMI 36.96 kg/m   Wt Readings from Last 3 Encounters:  11/06/20 229 lb (103.9 kg)  08/30/20 220 lb (99.8 kg)  08/21/20 221 lb (100.2 kg)    Physical Exam Vitals and nursing note reviewed. Exam conducted with a chaperone present.  Constitutional:      General: She is not in acute distress.    Appearance: She is well-developed. She is obese. She is not toxic-appearing.  HENT:     Head: Normocephalic and atraumatic.  Pulmonary:     Effort: Pulmonary effort is normal.  Chest:  Breasts:     Right: Normal.     Left: Normal.    Abdominal:     Palpations: Abdomen is soft. There is no mass.     Tenderness: There is no abdominal tenderness. There is no guarding or rebound.  Genitourinary:    Labia:        Right: No rash, tenderness or lesion.        Left: No rash, tenderness or lesion.      Vagina: Normal.     Cervix: No cervical motion tenderness, discharge or friability.     Adnexa:        Right: No mass, tenderness or fullness.          Left: No mass, tenderness or fullness.       Comments: Biochemist, clinical assisted) Skin:    General: Skin is warm and dry.  Neurological:     Mental Status: She is alert and oriented to person, place, and time.  Psychiatric:        Behavior: Behavior normal.     Results for orders placed or performed during the hospital encounter of 11/01/20  Hepatic function panel  Result Value Ref Range   Total Protein 7.2 6.5 - 8.1 g/dL   Albumin 3.9 3.5 - 5.0 g/dL   AST 23 15 - 41 U/L   ALT 22 0 - 44 U/L   Alkaline Phosphatase 104 38 - 126 U/L   Total Bilirubin 0.6 0.3 - 1.2 mg/dL   Bilirubin, Direct <5.3 0.0 - 0.2 mg/dL   Indirect Bilirubin NOT CALCULATED 0.3 - 0.9 mg/dL  Lipid panel  Result Value Ref Range   Cholesterol 194 0 - 200 mg/dL   Triglycerides 299 (H) <150 mg/dL  HDL 54 >40 mg/dL   Total CHOL/HDL Ratio 3.6 RATIO   VLDL 34 0 - 40 mg/dL   LDL Cholesterol 782 (H) 0 - 99 mg/dL      Assessment & Plan:    Encounter Diagnoses  Name Primary?  . Routine Papanicolaou smear Yes  . Hyperlipidemia, unspecified hyperlipidemia type   . Not proficient in [place] language   . Obesity, unspecified classification, unspecified obesity type, unspecified whether serious comorbidity present       -reviewed labs with pt -screening mammogram is up to date -colon cancer screening is up to date -pt to continue current medications -pt to follow up 4 months.  She is to contact office sooner prn

## 2020-11-07 LAB — CYTOLOGY - PAP
Adequacy: ABSENT
Comment: NEGATIVE
Diagnosis: NEGATIVE
High risk HPV: NEGATIVE

## 2020-11-13 ENCOUNTER — Other Ambulatory Visit: Payer: Self-pay | Admitting: Physician Assistant

## 2020-11-13 MED ORDER — METRONIDAZOLE 500 MG PO TABS: 500.0000 mg | ORAL_TABLET | Freq: Two times a day (BID) | ORAL | 0 refills | Status: AC

## 2021-01-30 ENCOUNTER — Encounter: Payer: Self-pay | Admitting: Physician Assistant

## 2021-01-30 ENCOUNTER — Ambulatory Visit: Payer: Self-pay | Admitting: Physician Assistant

## 2021-01-30 VITALS — BP 112/76 | HR 98 | Temp 97.8°F | Wt 218.0 lb

## 2021-01-30 DIAGNOSIS — L304 Erythema intertrigo: Secondary | ICD-10-CM

## 2021-01-30 MED ORDER — MICONAZOLE NITRATE 2 % EX CREA: 1.0000 "application " | TOPICAL_CREAM | Freq: Two times a day (BID) | CUTANEOUS | 0 refills | Status: DC

## 2021-01-30 NOTE — Patient Instructions (Signed)
Intertrigo Intertrigo El intertrigo es una irritacin o inflamacin de la piel (dermatitis) que ocurre cuando los pliegues de la piel se rozan entre s. La irritacin puede causar una erupcin cutnea, y que la piel quede en carne viva y con picazn. Esta afeccin es ms frecuente en los pliegues de la piel de esta reas: Los dedos de los pies. Las [place]. La ingle. Debajo del vientre. Debajo de las mamas. Las nalgas. El intertrigo no se transmite de [place] persona a otra (no es contagioso). Cules son las causas? Esta afeccin es causada por el calor, la humedad, el roce (friccin) y la falta de una buena circulacin de aire. Esta afeccin puede empeorar debido a lo siguiente: Transpiracin. Bacterias. Un hongo, como las levaduras. Qu incrementa el riesgo? Esta afeccin es ms probable que ocurra si tiene [employer] de la piel. Es ms probable que tenga esta afeccin si: Tiene diabetes. Tiene sobrepeso. No puede moverse o no realiza [place]. Vive en un rea de clima clido y hmedo. [place] frulas, dispositivos ortopdicos u otros dispositivos mdicos. No puede controlar los intestinos o la vejiga (tiene incontinencia). Cules son los signos o los sntomas? Los sntomas de esta afeccin incluyen los siguientes: Una erupcin cutnea rosa o roja en el pliegue de la piel o cerca del pliegue de la piel. Piel en carne viva o escamosa. Picazn. Sensacin de ardor. Sangrado. Secrecin de lquido. Mal olor. Cmo se diagnostica? Esta afeccin se diagnostica mediante una revisin de los antecedentes mdicos y un examen fsico. Es posible que le indiquen un hisopado cutneo paradetectar bacterias u hongos. Cmo se trata? El tratamiento para esta afeccin puede incluir lo siguiente: Pharmacologist la piel limpia y Magazine features editor. Tomar un antibitico o usar una crema antibitica en la piel para combatir una infeccin bacteriana. Usar una crema antimictica en la piel o tomar pldoras para  tratar una infeccin causada por hongos, como levaduras. Usar una pomada con corticoesteroides para Technical sales engineer picazn y Youth worker. Separar el pliegue de la piel con un pao de algodn limpio para que absorba la humedad y permita que circule aire por la zona. Siga estas indicaciones en su casa: Mantenga la zona afectada limpia y seca. No se rasque la piel. Permanezca en un ambiente fresco tanto como sea posible. Si es factible, use el aire acondicionado o IT consultant. Aplquese los medicamentos de venta libre y los recetados solamente como se lo haya indicado el mdico. Si le recetaron un antibitico, tmelo como se lo haya indicado el mdico. No deje de usar el antibitico aunque la afeccin mejore. Concurra a todas las visitas de [address] se lo haya indicado el mdico. Esto es importante. Cmo se evita?  Mantenga un peso saludable. Cuide sus pies, especialmente si es diabtico. El cuidado de los pies incluye los siguiente: Usar zapatos que calcen bien. Mantener los pies secos. Usar calcetines limpios y transpirables. Proteja la piel alrededor de la ingle y las nalgas, especialmente si tiene incontinencia. La proteccin de la piel incluye lo siguiente: Seguir una rutina de limpieza regular. Usar cremas, polvos o pomadas que protejan la piel. Cambiar los apsitos protectores con frecuencia. No use ropa ajustada. Use ropa suelta, absorbente y hecha de algodn. Use un sostn que proporcione buen soporte, si es necesario. Dchese y squese bien despus de realizar una actividad o de hacer ejercicio. Use un secador con aire fro para secar la [employer] pliegues, especialmente despus de baarse. Si tiene diabetes, mantenga bajo control el nivel de  azcar en la sangre. Comunquese con un mdico si: Los sntomas no mejoran con Scientist, research (medical). Los sntomas empeoran o se extienden a Corporate treasurer del cuerpo. Nota ms enrojecimiento y calor. Tiene fiebre. Resumen El  intertrigo es una irritacin o inflamacin de la piel (dermatitis) que ocurre cuando los pliegues de la piel se rozan entre s. Esta afeccin es causada por el calor, la humedad, el roce (friccin) y la falta de una buena circulacin de aire. Esta afeccin puede tratarse limpiando y secando la piel y [place]. Aplquese los medicamentos de venta libre y los recetados solamente como se lo haya indicado el mdico. Oceanographer a todas las visitas de seguimiento como se lo haya indicado el mdico. Esto es importante. Esta informacin no tiene Theme park manager el consejo del mdico. Asegresede hacerle al mdico cualquier pregunta que tenga. Document Revised: 12/10/2017 Document Reviewed: 12/10/2017 Elsevier Patient Education  2022 [employer].

## 2021-01-30 NOTE — Progress Notes (Signed)
   BP 112/76   Pulse 98   Temp 97.8 F (36.6 C)   Wt 218 lb (98.9 kg)   LMP 06/16/2008   SpO2 96%   BMI 35.19 kg/m    Subjective:    Patient ID: Heidi Gibson, female    DOB: 01-10-1966, 54 y.o.   MRN: [id number]  HPI: [name] is a 55 y.o. female presenting on 01/30/2021 for No chief complaint on file.   HPI   Pt had a negative covid 19 screening questionnaire.     Pt c/o chafing x 1 week.  She is using OTC desitin and bacitracin which helped some.  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  She sweats  a lot.     Relevant past medical, surgical, family and social history reviewed and updated as indicated. Interim medical history since our last visit reviewed. Allergies and medications reviewed and updated.  CURRENT MEDS: simvastatin  Review of Systems  Per HPI unless specifically indicated above     Objective:    BP 112/76   Pulse 98   Temp 97.8 F (36.6 C)   Wt 218 lb (98.9 kg)   LMP 06/16/2008   SpO2 96%   BMI 35.19 kg/m   Wt Readings from Last 3 Encounters:  01/30/21 218 lb (98.9 kg)  11/06/20 229 lb (103.9 kg)  08/30/20 220 lb (99.8 kg)    Physical Exam Constitutional:      General: She is not in acute distress.    Appearance: She is obese. She is not toxic-appearing.  HENT:     Head: Normocephalic and atraumatic.  Pulmonary:     Effort: No respiratory distress.  Skin:    Findings: Erythema present.     Comments: Red rash in groin folds  Neurological:     Mental Status: She is alert and oriented to person, place, and time.  Psychiatric:        Behavior: Behavior normal.          Assessment & Plan:    Encounter Diagnosis  Name Primary?   Intertrigo Yes      Rx miconazole and counseled pt.  She was given handout.  She will follow up next month as scheduled.  She is to contact office sooner prn worsening or new symptoms

## 2021-02-21 ENCOUNTER — Other Ambulatory Visit: Payer: Self-pay | Admitting: Physician Assistant

## 2021-02-21 DIAGNOSIS — E785 Hyperlipidemia, unspecified: Secondary | ICD-10-CM

## 2021-03-11 ENCOUNTER — Ambulatory Visit: Payer: Self-pay | Admitting: Physician Assistant

## 2021-03-14 ENCOUNTER — Other Ambulatory Visit: Payer: Self-pay

## 2021-03-14 ENCOUNTER — Encounter: Payer: Self-pay | Admitting: Physician Assistant

## 2021-03-14 ENCOUNTER — Ambulatory Visit: Payer: Self-pay | Admitting: Physician Assistant

## 2021-03-14 ENCOUNTER — Other Ambulatory Visit (HOSPITAL_COMMUNITY)
Admission: RE | Admit: 2021-03-14 | Discharge: 2021-03-14 | Disposition: A | Discharge status: Home / Self Care | Payer: Self-pay | Source: Ambulatory Visit | Attending: Physician Assistant | Admitting: Physician Assistant

## 2021-03-14 VITALS — BP 109/70 | HR 88 | Temp 97.1°F | Wt 219.0 lb

## 2021-03-14 DIAGNOSIS — E785 Hyperlipidemia, unspecified: Secondary | ICD-10-CM | POA: Insufficient documentation

## 2021-03-14 DIAGNOSIS — Z789 Other specified health status: Secondary | ICD-10-CM

## 2021-03-14 DIAGNOSIS — E669 Obesity, unspecified: Secondary | ICD-10-CM

## 2021-03-14 LAB — HEPATIC FUNCTION PANEL
ALT: 16 U/L (ref 0–44)
AST: 21 U/L (ref 15–41)
Albumin: 4.1 g/dL (ref 3.5–5.0)
Alkaline Phosphatase: 95 U/L (ref 38–126)
Bilirubin, Direct: 0.1 mg/dL (ref 0.0–0.2)
Indirect Bilirubin: 0.4 mg/dL (ref 0.3–0.9)
Total Bilirubin: 0.5 mg/dL (ref 0.3–1.2)
Total Protein: 7.4 g/dL (ref 6.5–8.1)

## 2021-03-14 LAB — LIPID PANEL
Cholesterol: 148 mg/dL (ref 0–200)
HDL: 53 mg/dL (ref >40)
LDL Cholesterol: 77 mg/dL (ref 0–99)
Total CHOL/HDL Ratio: 2.8 RATIO
Triglycerides: 90 mg/dL (ref <150)
VLDL: 18 mg/dL (ref 0–40)

## 2021-03-14 MED ORDER — SIMVASTATIN 20 MG PO TABS: ORAL_TABLET | ORAL | 6 refills | Status: DC

## 2021-03-14 NOTE — Progress Notes (Signed)
BP 109/70   Pulse 88   Temp (!) 97.1 F (36.2 C)   Wt 219 lb (99.3 kg)   LMP 06/16/2008   SpO2 98%   BMI 35.35 kg/m    Subjective:    Patient ID: Heidi Gibson, female    DOB: 11-30-1965, 55 y.o.   MRN: [id number]  HPI: Heidi Gibson is a 55 y.o. female presenting on 03/14/2021 for Hyperlipidemia   HPI  Pt had a negative covid 19 screening questionnaire.    She is having shoulder/neck pain started yesterday due to her work.  shE is also trying to eat healthier and has lost some weight.      Relevant past medical, surgical, family and social history reviewed and updated as indicated. Interim medical history since our last visit reviewed. Allergies and medications reviewed and updated.  CURRENT MEDS: Simvastatin APAP IBU   Review of Systems  Per HPI unless specifically indicated above     Objective:    BP 109/70   Pulse 88   Temp (!) 97.1 F (36.2 C)   Wt 219 lb (99.3 kg)   LMP 06/16/2008   SpO2 98%   BMI 35.35 kg/m   Wt Readings from Last 3 Encounters:  03/14/21 219 lb (99.3 kg)  01/30/21 218 lb (98.9 kg)  11/06/20 229 lb (103.9 kg)    Physical Exam Vitals reviewed.  Constitutional:      General: She is not in acute distress.    Appearance: She is well-developed. She is not ill-appearing.  HENT:     Head: Normocephalic and atraumatic.  Cardiovascular:     Rate and Rhythm: Normal rate and regular rhythm.  Pulmonary:     Effort: Pulmonary effort is normal.     Breath sounds: Normal breath sounds.  Abdominal:     General: Bowel sounds are normal.     Palpations: Abdomen is soft. There is no mass.     Tenderness: There is no abdominal tenderness.  Musculoskeletal:     Cervical back: Neck supple.     Right lower leg: No edema.     Left lower leg: No edema.  Lymphadenopathy:     Cervical: No cervical adenopathy.  Skin:    General: Skin is warm and dry.  Neurological:     Mental Status: She is alert and  oriented to person, place, and time.  Psychiatric:        Attention and Perception: Attention normal.        Mood and Affect: Mood normal.        Speech: Speech normal.        Behavior: Behavior normal. Behavior is cooperative.    Results for orders placed or performed during the hospital encounter of 03/14/21  Lipid panel  Result Value Ref Range   Cholesterol 148 0 - 200 mg/dL   Triglycerides 90 <846 mg/dL   HDL 53 >96 mg/dL   Total CHOL/HDL Ratio 2.8 RATIO   VLDL 18 0 - 40 mg/dL   LDL Cholesterol 77 0 - 99 mg/dL  Hepatic function panel  Result Value Ref Range   Total Protein 7.4 6.5 - 8.1 g/dL   Albumin 4.1 3.5 - 5.0 g/dL   AST 21 15 - 41 U/L   ALT 16 0 - 44 U/L   Alkaline Phosphatase 95 38 - 126 U/L   Total Bilirubin 0.5 0.3 - 1.2 mg/dL   Bilirubin, Direct 0.1 0.0 - 0.2 mg/dL   Indirect  Bilirubin 0.4 0.3 - 0.9 mg/dL      Assessment & Plan:   Encounter Diagnoses  Name Primary?   Hyperlipidemia, unspecified hyperlipidemia type Yes   Not proficient in English language    Obesity, unspecified classification, unspecified obesity type, unspecified whether serious comorbidity present      -reviewed labs with pt  -She already got flu shot -She is scheduled for covid booster -pt to Continue zocor -she is encouraged to continue healthy diet and exercise -pt to follow up 6 months.  She is to contact office sooner prn

## 2021-07-31 ENCOUNTER — Other Ambulatory Visit: Payer: Self-pay | Admitting: Physician Assistant

## 2021-07-31 DIAGNOSIS — E785 Hyperlipidemia, unspecified: Secondary | ICD-10-CM

## 2021-08-13 ENCOUNTER — Ambulatory Visit: Payer: Self-pay | Admitting: Physician Assistant

## 2021-08-21 ENCOUNTER — Other Ambulatory Visit (HOSPITAL_COMMUNITY)
Admission: RE | Admit: 2021-08-21 | Discharge: 2021-08-21 | Disposition: A | Discharge status: Home / Self Care | Payer: Self-pay | Source: Ambulatory Visit | Attending: Physician Assistant | Admitting: Physician Assistant

## 2021-08-21 ENCOUNTER — Other Ambulatory Visit: Payer: Self-pay

## 2021-08-21 ENCOUNTER — Encounter: Payer: Self-pay | Admitting: Physician Assistant

## 2021-08-21 ENCOUNTER — Ambulatory Visit: Payer: Self-pay | Admitting: Physician Assistant

## 2021-08-21 VITALS — BP 101/60 | HR 91 | Temp 97.4°F | Wt 222.0 lb

## 2021-08-21 DIAGNOSIS — Z789 Other specified health status: Secondary | ICD-10-CM

## 2021-08-21 DIAGNOSIS — Z1211 Encounter for screening for malignant neoplasm of colon: Secondary | ICD-10-CM

## 2021-08-21 DIAGNOSIS — Z1239 Encounter for other screening for malignant neoplasm of breast: Secondary | ICD-10-CM

## 2021-08-21 DIAGNOSIS — E785 Hyperlipidemia, unspecified: Secondary | ICD-10-CM | POA: Insufficient documentation

## 2021-08-21 DIAGNOSIS — E669 Obesity, unspecified: Secondary | ICD-10-CM

## 2021-08-21 LAB — COMPREHENSIVE METABOLIC PANEL
ALT: 19 U/L (ref 0–44)
AST: 21 U/L (ref 15–41)
Albumin: 4 g/dL (ref 3.5–5.0)
Alkaline Phosphatase: 99 U/L (ref 38–126)
Anion gap: 7 (ref 5–15)
BUN: 12 mg/dL (ref 6–20)
CO2: 30 mmol/L (ref 22–32)
Calcium: 8.8 mg/dL — ABNORMAL LOW (ref 8.9–10.3)
Chloride: 102 mmol/L (ref 98–111)
Creatinine, Ser: 0.63 mg/dL (ref 0.44–1.00)
GFR, Estimated: >60 mL/min (ref >60)
Glucose, Bld: 97 mg/dL (ref 70–99)
Potassium: 3.8 mmol/L (ref 3.5–5.1)
Sodium: 139 mmol/L (ref 135–145)
Total Bilirubin: 0.5 mg/dL (ref 0.3–1.2)
Total Protein: 7.3 g/dL (ref 6.5–8.1)

## 2021-08-21 LAB — LIPID PANEL
Cholesterol: 208 mg/dL — ABNORMAL HIGH (ref 0–200)
HDL: 54 mg/dL (ref >40)
LDL Cholesterol: 123 mg/dL — ABNORMAL HIGH (ref 0–99)
Total CHOL/HDL Ratio: 3.9 RATIO
Triglycerides: 156 mg/dL — ABNORMAL HIGH (ref <150)
VLDL: 31 mg/dL (ref 0–40)

## 2021-08-21 MED ORDER — SIMVASTATIN 20 MG PO TABS: ORAL_TABLET | ORAL | 6 refills | Status: DC

## 2021-08-21 NOTE — Progress Notes (Signed)
? ?BP 101/60   Pulse 91   Temp (!) 97.4 ?F (36.3 ?C)   Wt 222 lb (100.7 kg)   LMP 06/16/2008   SpO2 99%   BMI 35.83 kg/m?   ? ?Subjective:  ? ? Patient ID: Heidi Gibson, female    DOB: [birth year], 56 y.o.   MRN: [id number] ? ?HPI: ?Heidi Gibson is a 55 y.o. female presenting on 08/21/2021 for Hyperlipidemia ? ? ?HPI ? ?Chief Complaint  ?Patient presents with  ? Hyperlipidemia  ? ? ?Pt says she is feeling well.  She hasn't really been following lowfat diet recently. ? ?She says that she had a pain in her right thigh one day last week that radiated all the way down into her varicose veins.  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   She says the pain went away.  She wonders if her chiropractor treatments might have caused her pain.   ? ?She reports seeing a chiropractor for about the last year and says that she was recently told that another year of treatments was recommended.  She says the treatments cause her pain and are very expensive.   ? ? ?Relevant past medical, surgical, family and social history reviewed and updated as indicated. Interim medical history since our last visit reviewed. ?Allergies and medications reviewed and updated. ? ? ? ?Current Outpatient Medications:  ?  simvastatin (ZOCOR) 20 MG tablet, TOME UNA TABLETA POR BOCA AL DORMIR., Disp: 30 tablet, Rfl: 6 ? ? ?Review of Systems ? ?Per HPI unless specifically indicated above ? ?   ?Objective:  ?  ?BP 101/60   Pulse 91   Temp (!) 97.4 ?F (36.3 ?C)   Wt 222 lb (100.7 kg)   LMP 06/16/2008   SpO2 99%   BMI 35.83 kg/m?   ?Wt Readings from Last 3 Encounters:  ?08/21/21 222 lb (100.7 kg)  ?03/14/21 219 lb (99.3 kg)  ?01/30/21 218 lb (98.9 kg)  ?  ?Physical Exam ?Vitals reviewed.  ?Constitutional:   ?   General: She is not in acute distress. ?   Appearance: She is well-developed. She is obese. She is not toxic-appearing.  ?HENT:  ?   Head: Normocephalic and atraumatic.  ?Cardiovascular:  ?   Rate and  Rhythm: Normal rate and regular rhythm.  ?Pulmonary:  ?   Effort: Pulmonary effort is normal.  ?   Breath sounds: Normal breath sounds.  ?Abdominal:  ?   General: Bowel sounds are normal.  ?   Palpations: Abdomen is soft. There is no mass.  ?   Tenderness: There is no abdominal tenderness.  ?Musculoskeletal:  ?   Cervical Gibson: Neck supple.  ?   Right lower leg: No swelling, tenderness or bony tenderness. No edema.  ?   Left lower leg: No edema.  ?   Comments: Varicosities RLE  ?Lymphadenopathy:  ?   Cervical: No cervical adenopathy.  ?Skin: ?   General: Skin is warm and dry.  ?Neurological:  ?   Mental Status: She is alert and oriented to person, place, and time.  ?Psychiatric:     ?   Attention and Perception: Attention normal.     ?   Mood and Affect: Mood normal.     ?   Speech: Speech normal.     ?   Behavior: Behavior normal. Behavior is cooperative.  ? ? ?Results for orders placed or performed during the hospital encounter of 08/21/21  ?Lipid  panel  ?Result Value Ref Range  ? Cholesterol 208 (H) 0 - 200 mg/dL  ? Triglycerides 156 (H) <150 mg/dL  ? HDL 54 >40 mg/dL  ? Total CHOL/HDL Ratio 3.9 RATIO  ? VLDL 31 0 - 40 mg/dL  ? LDL Cholesterol 123 (H) 0 - 99 mg/dL  ?Comprehensive metabolic panel  ?Result Value Ref Range  ? Sodium 139 135 - 145 mmol/L  ? Potassium 3.8 3.5 - 5.1 mmol/L  ? Chloride 102 98 - 111 mmol/L  ? CO2 30 22 - 32 mmol/L  ? Glucose, Bld 97 70 - 99 mg/dL  ? BUN 12 6 - 20 mg/dL  ? Creatinine, Ser 0.63 0.44 - 1.00 mg/dL  ? Calcium 8.8 (L) 8.9 - 10.3 mg/dL  ? Total Protein 7.3 6.5 - 8.1 g/dL  ? Albumin 4.0 3.5 - 5.0 g/dL  ? AST 21 15 - 41 U/L  ? ALT 19 0 - 44 U/L  ? Alkaline Phosphatase 99 38 - 126 U/L  ? Total Bilirubin 0.5 0.3 - 1.2 mg/dL  ? GFR, Estimated >60 >60 mL/min  ? Anion gap 7 5 - 15  ? ?   ?Assessment & Plan:  ? ?Encounter Diagnoses  ?Name Primary?  ? Hyperlipidemia, unspecified hyperlipidemia type Yes  ? Not proficient in English language   ? Obesity, unspecified classification,  unspecified obesity type, unspecified whether serious comorbidity present   ? Screening for colon cancer   ? Encounter for screening for malignant neoplasm of breast, unspecified screening modality   ? ? ? ? ?-reviewed labs with pt ?-pt counseled to Watch lowfat diet ?-Continue simvastatin ?-pt referred for screening mammogram ?-pt to update Enrollment through [place] ?-pt is given FIT test for colon cancer screening ?-pt is encouraged to contact office to return for MS evaluation and discuss what chiropractor is treating to see if she needs to continue there or if orthopedic referral is indicated.  She states understanding ?-pt to follow up 6 months for dyslipidemia ? ?

## 2021-09-11 ENCOUNTER — Other Ambulatory Visit (HOSPITAL_COMMUNITY): Payer: Self-pay | Admitting: Obstetrics and Gynecology

## 2021-09-11 DIAGNOSIS — Z1231 Encounter for screening mammogram for malignant neoplasm of breast: Secondary | ICD-10-CM

## 2021-10-03 ENCOUNTER — Ambulatory Visit: Payer: Self-pay | Admitting: Physician Assistant

## 2021-10-03 DIAGNOSIS — K120 Recurrent oral aphthae: Secondary | ICD-10-CM

## 2021-10-03 NOTE — Progress Notes (Signed)
? ?  LMP 06/16/2008   ? ?Subjective:  ? ? Patient ID: Heidi Gibson, female    DOB: 1965/11/01, 56 y.o.   MRN: [id number] ? ?HPI: ?Heidi Gibson is a 56 y.o. female presenting on 10/03/2021 for No chief complaint on file. ? ? ?HPI ? ?This is a telemedicine appointment.  It is via Telephone as pt was unable to get connected through [employer].  ? ?I connected with  Heidi Gibson on 10/03/21 by a video enabled telemedicine application and verified that I am speaking with the correct person using two identifiers. ?  ?I discussed the limitations of evaluation and management by telemedicine. The patient expressed understanding and agreed to proceed. ? ?Pt is at home.  Provider and translaotr are at office. ? ? ? ? ?Pt has been sick since last Wednesday.    She is feeling better now.  She was running a fever but it is gone now.  Last time she had fever was Sunday and Monday.  She has No ST today.  She had ST but they are gone now.   She has a little cough.  She has phlegm.  She has bumps now that she is concerned about.  Bumps on the inside of the mouth.  They burn when she tries to eat or drink.   She did not take a covid test when she was sick. ? ? ? ?Relevant past medical, surgical, family and social history reviewed and updated as indicated. Interim medical history since our last visit reviewed. ?Allergies and medications reviewed and updated. ? ?Review of Systems ? ?Per HPI unless specifically indicated above ? ?   ?Objective:  ?  ?LMP 06/16/2008   ?Wt Readings from Last 3 Encounters:  ?08/21/21 222 lb (100.7 kg)  ?03/14/21 219 lb (99.3 kg)  ?01/30/21 218 lb (98.9 kg)  ?  ?Physical Exam ?Pulmonary:  ?   Effort: No respiratory distress.  ?   Comments: Pt is talking in complete sentences without dyspnea. ?Neurological:  ?   Mental Status: She is alert and oriented to person, place, and time.  ?Psychiatric:     ?   Attention and Perception: Attention normal.     ?   Speech:  Speech normal.  ? ? ? ? ? ? ?   ?Assessment & Plan:  ? ? ? ?Encounter Diagnosis  ?Name Primary?  ? Aphthous ulcer Yes  ? ? ?-pt was as Counseled on diagnosis ?-she Can use OTC analgesic like oragel ?-she is encouraged to Avoid spicy foods ?-she is reassured that these can happen after an illness and they will resolve without sequelae    ? ?

## 2021-10-18 ENCOUNTER — Other Ambulatory Visit: Payer: Self-pay

## 2021-10-18 ENCOUNTER — Inpatient Hospital Stay (HOSPITAL_COMMUNITY): Payer: Self-pay | Attending: Obstetrics and Gynecology | Admitting: *Deleted

## 2021-10-18 ENCOUNTER — Encounter (HOSPITAL_COMMUNITY): Payer: Self-pay

## 2021-10-18 ENCOUNTER — Ambulatory Visit (HOSPITAL_COMMUNITY)
Admission: RE | Admit: 2021-10-18 | Discharge: 2021-10-18 | Disposition: A | Discharge status: Home / Self Care | Payer: Self-pay | Source: Ambulatory Visit | Attending: Obstetrics and Gynecology | Admitting: Obstetrics and Gynecology

## 2021-10-18 VITALS — BP 90/59 | Wt 224.7 lb

## 2021-10-18 DIAGNOSIS — Z1239 Encounter for other screening for malignant neoplasm of breast: Secondary | ICD-10-CM

## 2021-10-18 DIAGNOSIS — Z1211 Encounter for screening for malignant neoplasm of colon: Secondary | ICD-10-CM

## 2021-10-18 DIAGNOSIS — Z1231 Encounter for screening mammogram for malignant neoplasm of breast: Secondary | ICD-10-CM | POA: Insufficient documentation

## 2021-10-18 NOTE — Progress Notes (Signed)
[name] is a 56 y.o. female who presents to [facility] clinic today with no complaints.  ?  ?Pap Smear: Pap smear not completed today. Last Pap smear was 11/06/2020 at the [facility] and was normal with negative HPV. Per patient has no history of an abnormal Pap smear. Last Pap smear result is available in Epic. ?  ?Physical exam: ?Breasts ?Breasts symmetrical. No skin abnormalities bilateral breasts. No nipple retraction bilateral breasts. No nipple discharge bilateral breasts. No lymphadenopathy. No lumps palpated bilateral breasts. No complaints of pain or tenderness on exam. ? ?MS DIGITAL SCREENING TOMO BILATERAL ? ?Result Date: 07/27/2020 ?CLINICAL DATA:  Screening. EXAM: DIGITAL SCREENING BILATERAL MAMMOGRAM WITH TOMOSYNTHESIS AND CAD TECHNIQUE: Bilateral screening digital craniocaudal and mediolateral oblique mammograms were obtained. Bilateral screening digital breast tomosynthesis was performed. The images were evaluated with computer-aided detection. COMPARISON:  Previous exam(s). ACR Breast Density Category b: There are scattered areas of fibroglandular density. FINDINGS: There are no findings suspicious for malignancy. IMPRESSION: No mammographic evidence of malignancy. A result letter of this screening mammogram will be mailed directly to the patient. RECOMMENDATION: Screening mammogram in one year. (Code:SM-B-01Y) BI-RADS CATEGORY  1: Negative. Electronically Signed   By: Sande Brothers M.D.   On: 07/27/2020 16:09  ? ?MS DIGITAL SCREENING TOMO BILATERAL ? ?Result Date: 07/05/2019 ?CLINICAL DATA:  Screening. EXAM: DIGITAL SCREENING BILATERAL MAMMOGRAM WITH TOMO AND CAD COMPARISON:  Previous exam(s). ACR Breast Density Category b: There are scattered areas of fibroglandular density. FINDINGS: There are no findings suspicious for malignancy. Images were processed with CAD. IMPRESSION: No mammographic evidence of malignancy. A result letter of this screening  mammogram will be mailed directly to the patient. RECOMMENDATION: Screening mammogram in one year. (Code:SM-B-01Y) BI-RADS CATEGORY  1: Negative. Electronically Signed   By: Elberta Fortis M.D.   On: 07/05/2019 14:44  ? ?MS DIGITAL SCREENING TOMO BILATERAL ? ?Result Date: 03/24/2018 ?CLINICAL DATA:  Screening. EXAM: DIGITAL SCREENING BILATERAL MAMMOGRAM WITH TOMO AND CAD COMPARISON:  Previous exam(s). ACR Breast Density Category b: There are scattered areas of fibroglandular density. FINDINGS: There are no findings suspicious for malignancy. Images were processed with CAD. IMPRESSION: No mammographic evidence of malignancy. A result letter of this screening mammogram will be mailed directly to the patient. RECOMMENDATION: Screening mammogram in one year. (Code:SM-B-01Y) BI-RADS CATEGORY  1: Negative. Electronically Signed   By: Bary Richard M.D.   On: 03/24/2018 11:49   ?     ?Pelvic/Bimanual ?Pap is not indicated today per BCCCP guidelines. ?  ?Smoking History: ?Patient has never smoked. ?  ?Patient Navigation: ?Patient education provided. Access to services provided for patient through [facility] program. Spanish interpreter Lynnell Chad from [facility] provided.  ? ?Colorectal Cancer Screening: ?Per patient has never had colonoscopy completed. Patient completed a FIT Test 07/17/2020 that was negative and was recently given one by her PCP 09/17/2021 that she has not completed. Patient stated she will complete and return to her PCP. No complaints today.  ?  ?Breast and Cervical Cancer Risk Assessment: ?Patient does not have family history of breast cancer, known genetic mutations, or radiation treatment to the chest before age 61. Patient does not have history of cervical dysplasia, immunocompromised, or DES exposure in-utero. ? ?Risk Assessment   ? ? Risk Scores   ? ?   10/18/2021  ? Last edited by: Narda Rutherford, LPN  ? 5-year risk: 0.6 %  ? Lifetime risk: 4.3 %  ? ?  ?  ? ?  ? ? ?  A: ?BCCCP exam without pap smear ?No  complaints. ? ?P: ?Referred patient to [facility] for a screening mammogram. Appointment scheduled Friday, Oct 18, 2021 at 1130. ? ?Priscille Heidelberg, RN ?10/18/2021 11:04 AM   ?

## 2021-10-18 NOTE — Patient Instructions (Signed)
Explained breast self awareness with Heidi Gibson. Patient did not need a Pap smear today due to last Pap smear and HPV typing was 11/06/2020. Let her know BCCCP will cover Pap smears and HPV typing every 5 years unless has a history of abnormal Pap smears. Referred patient to [facility] for a screening mammogram. Appointment scheduled Friday, Oct 18, 2021 at 1130. Patient aware of appointment and will be there. Let patient know [facility] will follow up with her within the next couple weeks with results of her mammogram by letter or phone. [place] verbalized understanding. ? ?Granite Godman, Arvil Chaco, RN ?11:04 AM ? ? ? ? ?

## 2021-11-03 ENCOUNTER — Ambulatory Visit
Admission: EM | Admit: 2021-11-03 | Discharge: 2021-11-03 | Disposition: A | Discharge status: Home / Self Care | Payer: Self-pay | Attending: Family Medicine | Admitting: Family Medicine

## 2021-11-03 DIAGNOSIS — J0100 Acute maxillary sinusitis, unspecified: Secondary | ICD-10-CM

## 2021-11-03 DIAGNOSIS — K068 Other specified disorders of gingiva and edentulous alveolar ridge: Secondary | ICD-10-CM

## 2021-11-03 MED ORDER — FLUTICASONE PROPIONATE 50 MCG/ACT NA SUSP: 1.0000 | Freq: Two times a day (BID) | NASAL | 2 refills | Status: DC

## 2021-11-03 MED ORDER — AMOXICILLIN-POT CLAVULANATE 875-125 MG PO TABS: 1.0000 | ORAL_TABLET | Freq: Two times a day (BID) | ORAL | 0 refills | Status: DC

## 2021-11-03 MED ORDER — LIDOCAINE VISCOUS HCL 2 % MT SOLN: 5.0000 mL | OROMUCOSAL | 0 refills | Status: DC | PRN

## 2021-11-03 NOTE — ED Triage Notes (Signed)
Pt states that about 2 weeks ago she had a bad cold and her voice was hoarse and she called her PCP   Pt states that her lip was stuck to her gum and the PCP prescribed ome Beatrix Fetters but the pain came back in her mouth on Wednesday   Pt states she feels some fluid in both ears but mostly on the right ear  Denies Fever

## 2021-11-03 NOTE — ED Notes (Signed)
I-pad and Translator service used for triage, provider assessment, and discharge instructions being reviewed.

## 2021-11-03 NOTE — ED Provider Notes (Signed)
[facility]    CSN: [id number] Arrival date & time: 11/03/21  1103      History   Chief Complaint Chief Complaint  Patient presents with   Otalgia    Mouth pain and right ear pain     HPI Heidi Gibson is a 56 y.o. female.   Medical interpreter utilized today to facilitate visit with patient's consent.  She is presenting today with 2-week history of progressively worsening symptoms.  She states she had a virtual visit 2 weeks ago with her primary care provider for mouth sores, chills, body aches, fatigue, congestion and was told to try Orajel for the mouth pain and supportive measures at home.  She has not tested for viral illnesses at this time per patient.  She states her symptoms have not improved and have progressed to now congestion, facial pain and pressure extending down to dental pain, ear pain and pressure bilaterally, continued intermittent chills.  Not trying anything currently for symptoms.  No known new sick contacts recently.  No known history of pertinent chronic medical problems.   Past Medical History:  Diagnosis Date   Hyperlipidemia     Patient Active Problem List   Diagnosis Date Noted   Hyperlipidemia 08/27/2015   Morbid obesity (HCC) 08/27/2015    Past Surgical History:  Procedure Laterality Date   CESAREAN SECTION     TUBAL LIGATION      OB History   No obstetric history on file.      Home Medications    Prior to Admission medications   Medication Sig Start Date End Date Taking? Authorizing Provider  amoxicillin-clavulanate (AUGMENTIN) 875-125 MG tablet Take 1 tablet by mouth every 12 (twelve) hours. 11/03/21  Yes Particia NearingLane, Noha Milberger Elizabeth, PA-C  fluticasone [facility](FLONASE) 50 MCG/ACT nasal spray Place 1 spray into both nostrils 2 (two) times daily. 11/03/21  Yes Particia NearingLane, Dandria Griego Elizabeth, PA-C  lidocaine (XYLOCAINE) 2 % solution Use as directed 5 mLs in the mouth or throat every 3 (three) hours as needed for mouth pain.  11/03/21  Yes Particia NearingLane, Juvencio Verdi Elizabeth, PA-C  simvastatin (ZOCOR) 20 MG tablet TOME UNA TABLETA POR BOCA AL DORMIR. 08/21/21   Jacquelin HawkingMcElroy, Shannon, PA-C    Family History Family History  Problem Relation Age of Onset   Heart disease Father     Social History Social History   Tobacco Use   Smoking status: Never   Smokeless tobacco: Never  Vaping Use   Vaping Use: Never used  Substance Use Topics   Alcohol use: No   Drug use: No     Allergies   Patient has no known allergies.   Review of Systems Review of Systems Per HPI  Physical Exam Triage Vital Signs ED Triage Vitals  Enc Vitals Group     BP 11/03/21 1137 118/79     Pulse Rate 11/03/21 1137 (!) 110     Resp 11/03/21 1137 18     Temp 11/03/21 1137 99.2 F (37.3 C)     Temp Source 11/03/21 1137 Oral     SpO2 11/03/21 1137 96 %     Weight --      Height --      Head Circumference --      Peak Flow --      Pain Score 11/03/21 1143 7     Pain Loc --      Pain Edu? --      Excl. in GC? --    No data  found.  Updated Vital Signs BP 118/79 (BP Location: Right Arm)   Pulse (!) 110   Temp 99.2 F (37.3 C) (Oral)   Resp 18   LMP 06/16/2008   SpO2 96%   Visual Acuity Right Eye Distance:   Left Eye Distance:   Bilateral Distance:    Right Eye Near:   Left Eye Near:    Bilateral Near:     Physical Exam Vitals and nursing note reviewed.  Constitutional:      Appearance: Normal appearance. She is not ill-appearing.  HENT:     Head: Atraumatic.     Right Ear: External ear normal.     Left Ear: External ear normal.     Ears:     Comments: Bilateral middle ear effusions    Nose: Congestion present.     Mouth/Throat:     Mouth: Mucous membranes are moist.     Pharynx: Posterior oropharyngeal erythema present.     Comments: No appreciable oral sores or gingival disease Eyes:     Extraocular Movements: Extraocular movements intact.     Conjunctiva/sclera: Conjunctivae normal.  Cardiovascular:     Rate and  Rhythm: Normal rate and regular rhythm.     Heart sounds: Normal heart sounds.  Pulmonary:     Effort: Pulmonary effort is normal.     Breath sounds: Normal breath sounds. No wheezing.  Musculoskeletal:        General: Normal range of motion.     Cervical back: Normal range of motion and neck supple.  Skin:    General: Skin is warm and dry.  Neurological:     Mental Status: She is alert and oriented to person, place, and time.  Psychiatric:        Mood and Affect: Mood normal.        Thought Content: Thought content normal.        Judgment: Judgment normal.     UC Treatments / Results  Labs (all labs ordered are listed, but only abnormal results are displayed) Labs Reviewed - No data to display  EKG   Radiology No results found.  Procedures Procedures (including critical care time)  Medications Ordered in UC Medications - No data to display  Initial Impression / Assessment and Plan / UC Course  I have reviewed the triage vital signs and the nursing notes.  Pertinent labs & imaging results that were available during my care of the patient were reviewed by me and considered in my medical decision making (see chart for details).     Duration and worsening course, treat with Augmentin, Flonase, viscous lidocaine for the gingival pain though unclear if this is from sinus pressure or normal condition.  Follow-up with dentist if not resolving   Final Clinical Impressions(s) / UC Diagnoses   Final diagnoses:  Pain of gingiva  Acute maxillary sinusitis, recurrence not specified   Discharge Instructions   None    ED Prescriptions     Medication Sig Dispense Auth. Provider   amoxicillin-clavulanate (AUGMENTIN) 875-125 MG tablet Take 1 tablet by mouth every 12 (twelve) hours. 14 tablet Particia Nearing, PA-C   fluticasone [facility]) 50 MCG/ACT nasal spray Place 1 spray into both nostrils 2 (two) times daily. 16 g Particia Nearing, PA-C   lidocaine  (XYLOCAINE) 2 % solution Use as directed 5 mLs in the mouth or throat every 3 (three) hours as needed for mouth pain. 100 mL Particia Nearing, [place]      PDMP  not reviewed this encounter.   Particia Nearing, [place] 11/03/21 1240

## 2021-12-31 ENCOUNTER — Encounter: Payer: Self-pay | Admitting: Physician Assistant

## 2021-12-31 ENCOUNTER — Ambulatory Visit: Payer: Self-pay | Admitting: Physician Assistant

## 2021-12-31 VITALS — BP 110/70 | HR 88 | Temp 96.7°F | Wt 225.2 lb

## 2021-12-31 DIAGNOSIS — R140 Abdominal distension (gaseous): Secondary | ICD-10-CM

## 2021-12-31 DIAGNOSIS — N3090 Cystitis, unspecified without hematuria: Secondary | ICD-10-CM

## 2021-12-31 DIAGNOSIS — Z789 Other specified health status: Secondary | ICD-10-CM

## 2021-12-31 DIAGNOSIS — R300 Dysuria: Secondary | ICD-10-CM

## 2021-12-31 LAB — POCT URINALYSIS DIPSTICK
Appearance: ABNORMAL
Bilirubin, UA: NEGATIVE
Blood, UA: NEGATIVE
Glucose, UA: NEGATIVE
Ketones, UA: NEGATIVE
Nitrite, UA: NEGATIVE
Protein, UA: NEGATIVE
Spec Grav, UA: >=1.03 — AB (ref 1.010–1.025)
Urobilinogen, UA: 0.2 E.U./dL
pH, UA: 5.5 (ref 5.0–8.0)

## 2021-12-31 MED ORDER — SIMVASTATIN 20 MG PO TABS: ORAL_TABLET | ORAL | 6 refills | Status: DC

## 2021-12-31 MED ORDER — CEPHALEXIN 500 MG PO CAPS: 500.0000 mg | ORAL_CAPSULE | Freq: Four times a day (QID) | ORAL | 0 refills | Status: AC

## 2021-12-31 NOTE — Patient Instructions (Addendum)
Gas X - simethicone

## 2021-12-31 NOTE — Progress Notes (Signed)
BP 110/70   Pulse 88   Temp (!) 96.7 F (35.9 C)   Wt 225 lb 4 oz (102.2 kg)   LMP 06/16/2008   SpO2 97%   BMI 36.36 kg/m    Subjective:    Patient ID: Heidi Gibson, female    DOB: 08-Jul-1965, 56 y.o.   MRN: [id number]  HPI: Heidi Gibson is a 56 y.o. female presenting on 12/31/2021 for Dysuria (Sx began yesterday and worsened over night. Pt c/o burning when urinating and flank pain. Pt also reports yellow discharge and some itching.)   HPI   Chief Complaint  Patient presents with   Dysuria    Sx began yesterday and worsened over night. Pt c/o burning when urinating and flank pain. Pt also reports yellow discharge and some itching.    Pt denies fever, emesis, diarrhea.  She does report bloating, flatulence, sometimes a bit of nausea.   She says her discharge is no change from her baseline.       Relevant past medical, surgical, family and social history reviewed and updated as indicated. Interim medical history since our last visit reviewed. Allergies and medications reviewed and updated.   Current Outpatient Medications:    simvastatin (ZOCOR) 20 MG tablet, TOME UNA TABLETA POR BOCA AL DORMIR., Disp: 30 tablet, Rfl: 6    Review of Systems  Per HPI unless specifically indicated above     Objective:    BP 110/70   Pulse 88   Temp (!) 96.7 F (35.9 C)   Wt 225 lb 4 oz (102.2 kg)   LMP 06/16/2008   SpO2 97%   BMI 36.36 kg/m   Wt Readings from Last 3 Encounters:  12/31/21 225 lb 4 oz (102.2 kg)  10/18/21 224 lb 11.2 oz (101.9 kg)  08/21/21 222 lb (100.7 kg)    Physical Exam Vitals reviewed.  Constitutional:      Appearance: She is well-developed.  HENT:     Head: Normocephalic and atraumatic.  Cardiovascular:     Rate and Rhythm: Normal rate and regular rhythm.  Pulmonary:     Effort: Pulmonary effort is normal.     Breath sounds: Normal breath sounds.  Abdominal:     General: Bowel sounds are normal.      Palpations: Abdomen is soft. There is no hepatomegaly, splenomegaly, mass or pulsatile mass.     Tenderness: There is no abdominal tenderness. There is no right CVA tenderness, left CVA tenderness, guarding or rebound.     Comments: Mild generalized discomfort, NT.   Very active BS, gurgling.    Musculoskeletal:     Cervical back: Neck supple.  Lymphadenopathy:     Cervical: No cervical adenopathy.  Skin:    General: Skin is warm and dry.  Neurological:     Mental Status: She is alert and oriented to person, place, and time.  Psychiatric:        Behavior: Behavior normal.     Results for orders placed or performed in visit on 12/31/21  POCT Urinalysis Dipstick  Result Value Ref Range   Color, UA tangerine    Clarity, UA hazy    Glucose, UA Negative Negative   Bilirubin, UA negative    Ketones, UA negative    Spec Grav, UA >=1.030 (A) 1.010 - 1.025   Blood, UA negative    pH, UA 5.5 5.0 - 8.0   Protein, UA Negative Negative   Urobilinogen, UA 0.2 0.2 or  1.0 E.U./dL   Nitrite, UA negative    Leukocytes, UA Moderate (2+) (A) Negative   Appearance abnormal    Odor        Assessment & Plan:    Encounter Diagnoses  Name Primary?   Cystitis Yes   Dysuria    Bloating    Not proficient in English language      Rx keflex for urine Gas-x for bloating Pt to RTO if worsens, persists or new symptoms

## 2022-02-05 ENCOUNTER — Other Ambulatory Visit: Payer: Self-pay | Admitting: Physician Assistant

## 2022-02-05 DIAGNOSIS — E785 Hyperlipidemia, unspecified: Secondary | ICD-10-CM

## 2022-02-14 ENCOUNTER — Other Ambulatory Visit (HOSPITAL_COMMUNITY)
Admission: RE | Admit: 2022-02-14 | Discharge: 2022-02-14 | Disposition: A | Discharge status: Home / Self Care | Payer: Self-pay | Source: Ambulatory Visit | Attending: Physician Assistant | Admitting: Physician Assistant

## 2022-02-14 DIAGNOSIS — E785 Hyperlipidemia, unspecified: Secondary | ICD-10-CM | POA: Insufficient documentation

## 2022-02-14 LAB — COMPREHENSIVE METABOLIC PANEL
ALT: 15 U/L (ref 0–44)
AST: 20 U/L (ref 15–41)
Albumin: 4 g/dL (ref 3.5–5.0)
Alkaline Phosphatase: 96 U/L (ref 38–126)
Anion gap: 7 (ref 5–15)
BUN: 11 mg/dL (ref 6–20)
CO2: 28 mmol/L (ref 22–32)
Calcium: 8.9 mg/dL (ref 8.9–10.3)
Chloride: 106 mmol/L (ref 98–111)
Creatinine, Ser: 0.56 mg/dL (ref 0.44–1.00)
GFR, Estimated: >60 mL/min (ref >60)
Glucose, Bld: 99 mg/dL (ref 70–99)
Potassium: 4 mmol/L (ref 3.5–5.1)
Sodium: 141 mmol/L (ref 135–145)
Total Bilirubin: 0.9 mg/dL (ref 0.3–1.2)
Total Protein: 7.3 g/dL (ref 6.5–8.1)

## 2022-02-14 LAB — LIPID PANEL
Cholesterol: 190 mg/dL (ref 0–200)
HDL: 55 mg/dL (ref >40)
LDL Cholesterol: 120 mg/dL — ABNORMAL HIGH (ref 0–99)
Total CHOL/HDL Ratio: 3.5 RATIO
Triglycerides: 73 mg/dL (ref <150)
VLDL: 15 mg/dL (ref 0–40)

## 2022-02-19 ENCOUNTER — Ambulatory Visit: Payer: Self-pay | Admitting: Physician Assistant

## 2022-02-19 ENCOUNTER — Encounter: Payer: Self-pay | Admitting: Physician Assistant

## 2022-02-19 VITALS — BP 110/70 | HR 110 | Temp 97.7°F | Wt 222.0 lb

## 2022-02-19 DIAGNOSIS — E669 Obesity, unspecified: Secondary | ICD-10-CM

## 2022-02-19 DIAGNOSIS — Z789 Other specified health status: Secondary | ICD-10-CM

## 2022-02-19 DIAGNOSIS — Z1211 Encounter for screening for malignant neoplasm of colon: Secondary | ICD-10-CM

## 2022-02-19 DIAGNOSIS — E785 Hyperlipidemia, unspecified: Secondary | ICD-10-CM

## 2022-02-19 LAB — POC FIT TEST STOOL: Fecal Occult Blood: NEGATIVE

## 2022-02-19 MED ORDER — SIMVASTATIN 20 MG PO TABS: ORAL_TABLET | ORAL | 6 refills | Status: DC

## 2022-02-19 NOTE — Progress Notes (Signed)
BP 110/70   Pulse (!) 110   Temp 97.7 F (36.5 C)   Wt 222 lb (100.7 kg)   LMP 06/16/2008   SpO2 98%   BMI 35.83 kg/m    Subjective:    Patient ID: Heidi Gibson, female    DOB: 16-Nov-1965, 56 y.o.   MRN: [id number]  HPI: Heidi Gibson is a 56 y.o. female presenting on 02/19/2022 for Hyperlipidemia   HPI   Chief Complaint  Patient presents with   Hyperlipidemia     She missed her zocor frequently over the past month.   She says she is doing well today and has no complaints.   She says she was rushing to get here today.     Relevant past medical, surgical, family and social history reviewed and updated as indicated. Interim medical history since our last visit reviewed. Allergies and medications reviewed and updated.   Current Outpatient Medications:    simvastatin (ZOCOR) 20 MG tablet, TOME UNA TABLETA POR BOCA AL DORMIR., Disp: 30 tablet, Rfl: 6    Review of Systems  Per HPI unless specifically indicated above     Objective:    BP 110/70   Pulse (!) 110   Temp 97.7 F (36.5 C)   Wt 222 lb (100.7 kg)   LMP 06/16/2008   SpO2 98%   BMI 35.83 kg/m   Wt Readings from Last 3 Encounters:  02/19/22 222 lb (100.7 kg)  12/31/21 225 lb 4 oz (102.2 kg)  10/18/21 224 lb 11.2 oz (101.9 kg)    Physical Exam Vitals reviewed.  Constitutional:      General: She is not in acute distress.    Appearance: She is well-developed. She is obese. She is not toxic-appearing.  HENT:     Head: Normocephalic and atraumatic.  Cardiovascular:     Rate and Rhythm: Normal rate and regular rhythm.  Pulmonary:     Effort: Pulmonary effort is normal.     Breath sounds: Normal breath sounds.  Abdominal:     General: Bowel sounds are normal.     Palpations: Abdomen is soft. There is no mass.     Tenderness: There is no abdominal tenderness.  Musculoskeletal:     Cervical back: Neck supple.     Right lower leg: No edema.     Left lower leg: No  edema.  Lymphadenopathy:     Cervical: No cervical adenopathy.  Skin:    General: Skin is warm and dry.  Neurological:     Mental Status: She is alert and oriented to person, place, and time.  Psychiatric:        Behavior: Behavior normal.     Results for orders placed or performed during the hospital encounter of 02/14/22  Lipid panel  Result Value Ref Range   Cholesterol 190 0 - 200 mg/dL   Triglycerides 73 <937 mg/dL   HDL 55 >34 mg/dL   Total CHOL/HDL Ratio 3.5 RATIO   VLDL 15 0 - 40 mg/dL   LDL Cholesterol 287 (H) 0 - 99 mg/dL  Comprehensive metabolic panel  Result Value Ref Range   Sodium 141 135 - 145 mmol/L   Potassium 4.0 3.5 - 5.1 mmol/L   Chloride 106 98 - 111 mmol/L   CO2 28 22 - 32 mmol/L   Glucose, Bld 99 70 - 99 mg/dL   BUN 11 6 - 20 mg/dL   Creatinine, Ser 6.81 0.44 - 1.00 mg/dL  Calcium 8.9 8.9 - 10.3 mg/dL   Total Protein 7.3 6.5 - 8.1 g/dL   Albumin 4.0 3.5 - 5.0 g/dL   AST 20 15 - 41 U/L   ALT 15 0 - 44 U/L   Alkaline Phosphatase 96 38 - 126 U/L   Total Bilirubin 0.9 0.3 - 1.2 mg/dL   GFR, Estimated >11 >17 mL/min   Anion gap 7 5 - 15      Assessment & Plan:    Encounter Diagnoses  Name Primary?   Hyperlipidemia, unspecified hyperlipidemia type Yes   Not proficient in English language    Screening for colon cancer    Obesity, unspecified classification, unspecified obesity type, unspecified whether serious comorbidity present      -reviewed labs wih pt  -Pt just brougth in her FIT test today for colon cancer screening.  She will be called with results -pt encouraged to Take zocor daily and maintain lowfat diet -pt to follow up 4 months.  She is to contact office sooner prn

## 2022-05-31 IMAGING — MG MM DIGITAL SCREENING BILAT W/ TOMO AND CAD
6 of 12 series · 6 of 36 positions shown · non-contrast
Comparison: Previous exam(s).

CLINICAL DATA: Screening.

EXAM:
DIGITAL SCREENING BILATERAL MAMMOGRAM WITH TOMOSYNTHESIS AND CAD
TECHNIQUE: Bilateral screening digital craniocaudal and mediolateral oblique
mammograms were obtained. Bilateral screening digital breast
tomosynthesis was performed. The images were evaluated with
computer-aided detection.

[R CC synth-2D (1 of 2)]
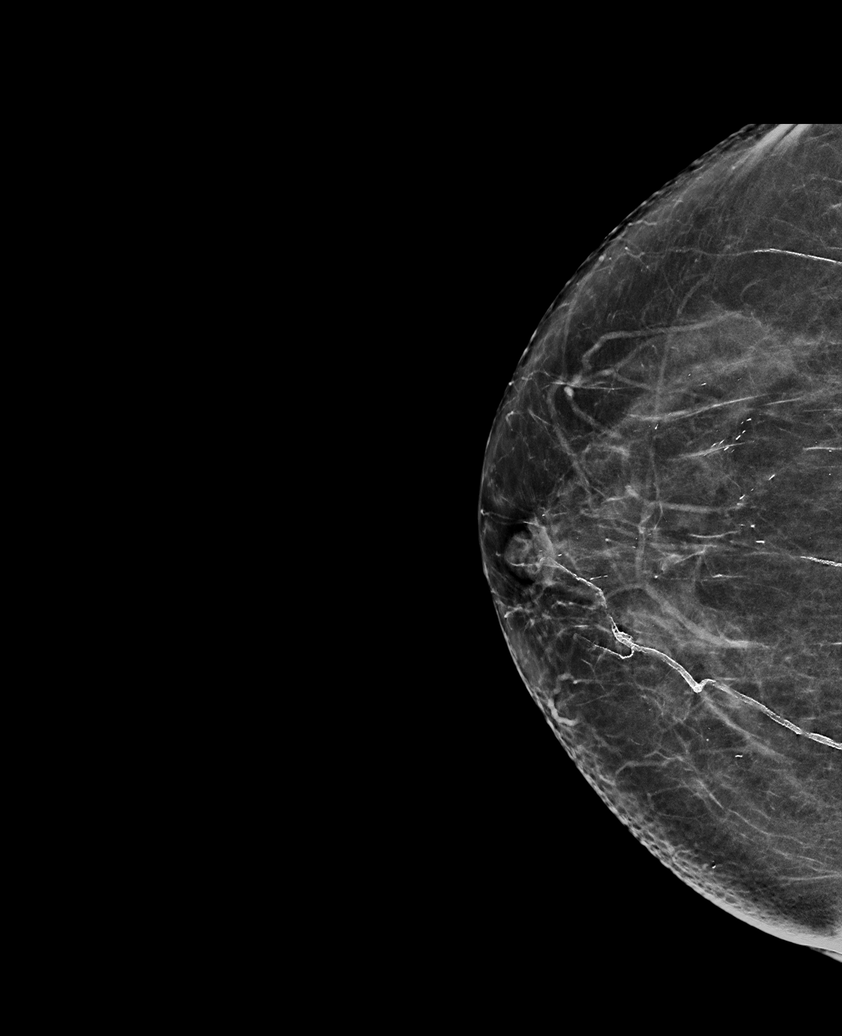

[L CC synth-2D]
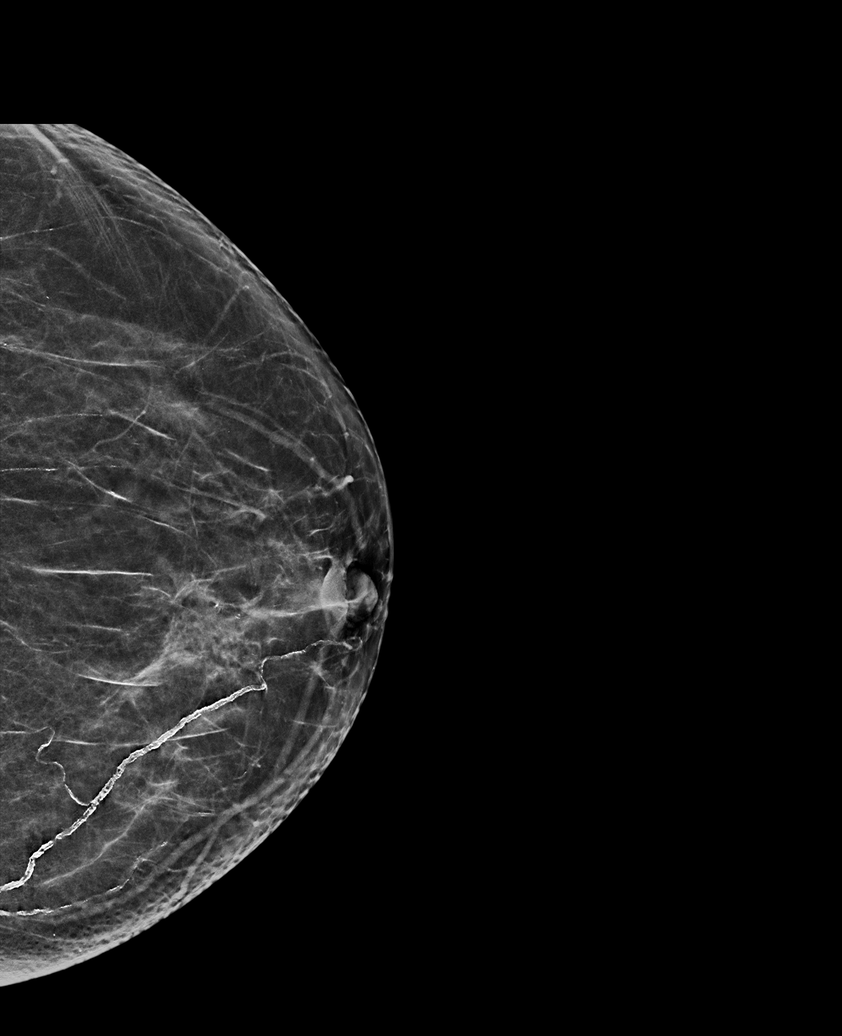

[L MLO synth-2D (1 of 2)]
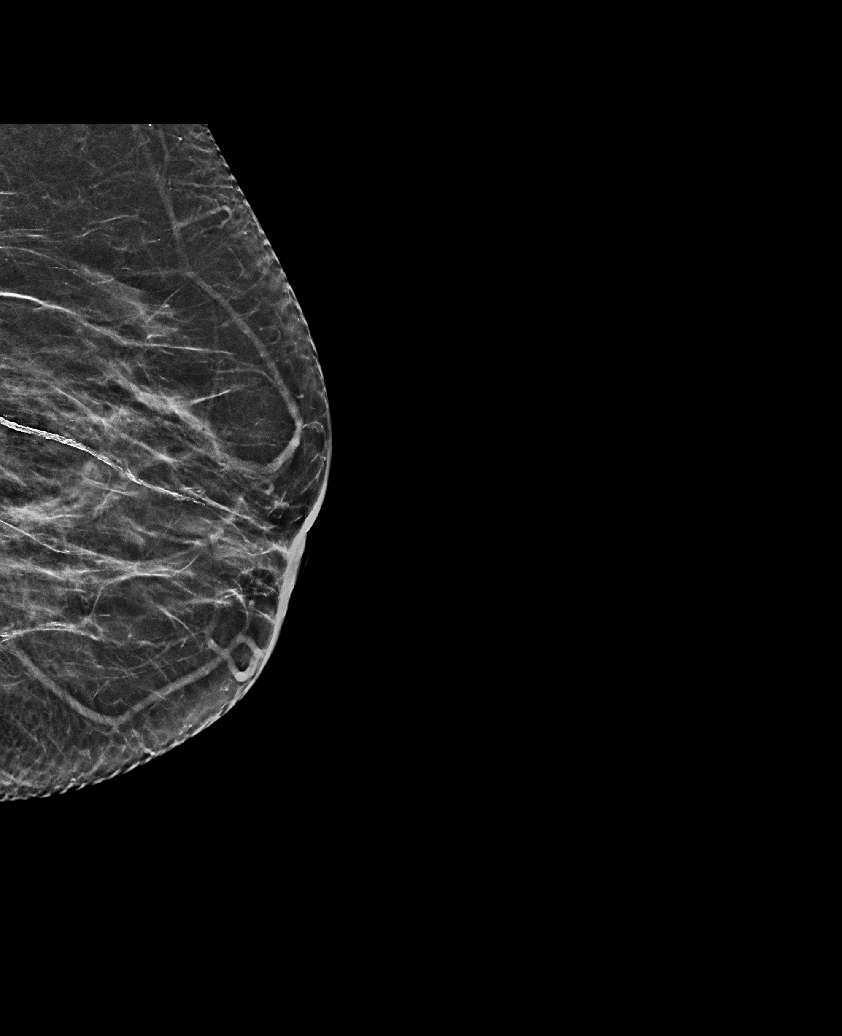

[L MLO synth-2D (2 of 2)]
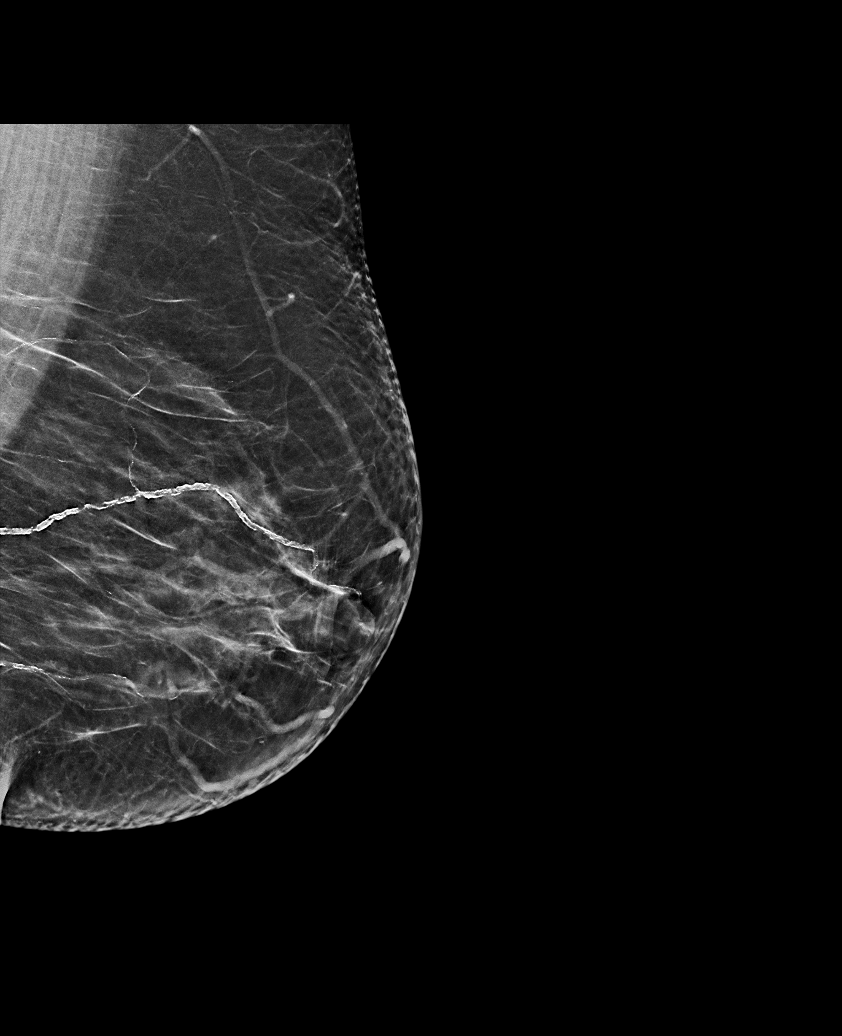

[R MLO synth-2D]
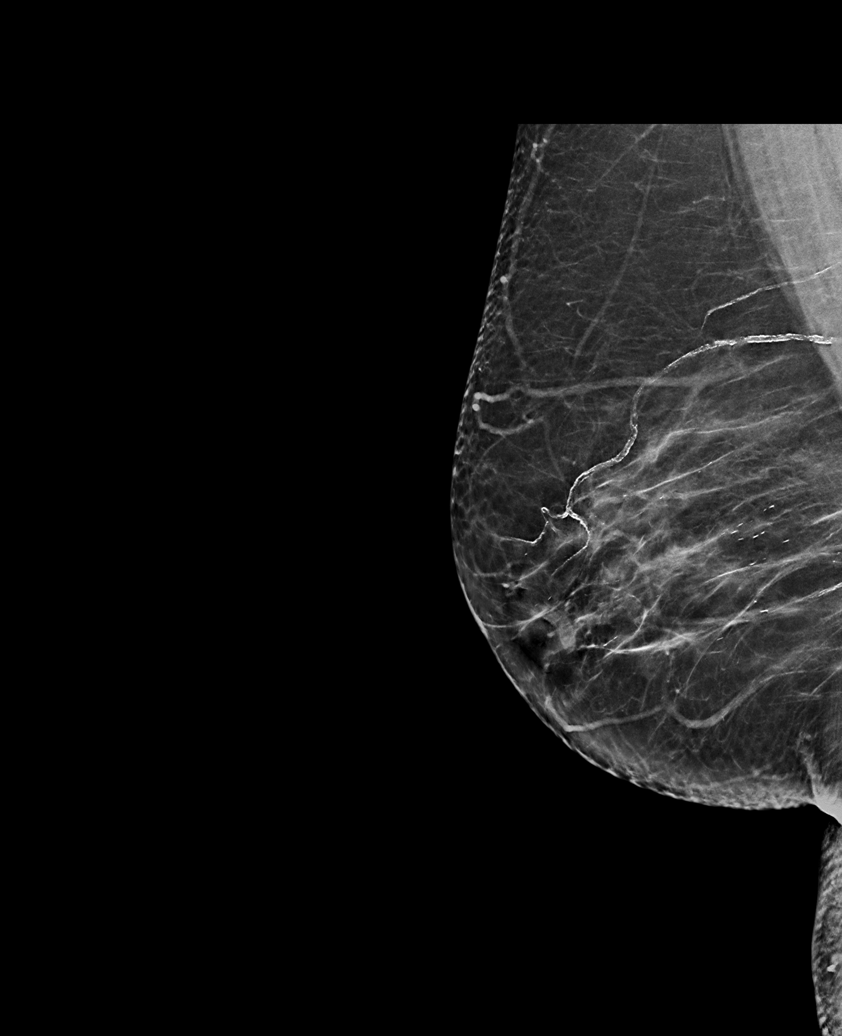

[R CC synth-2D (2 of 2)]
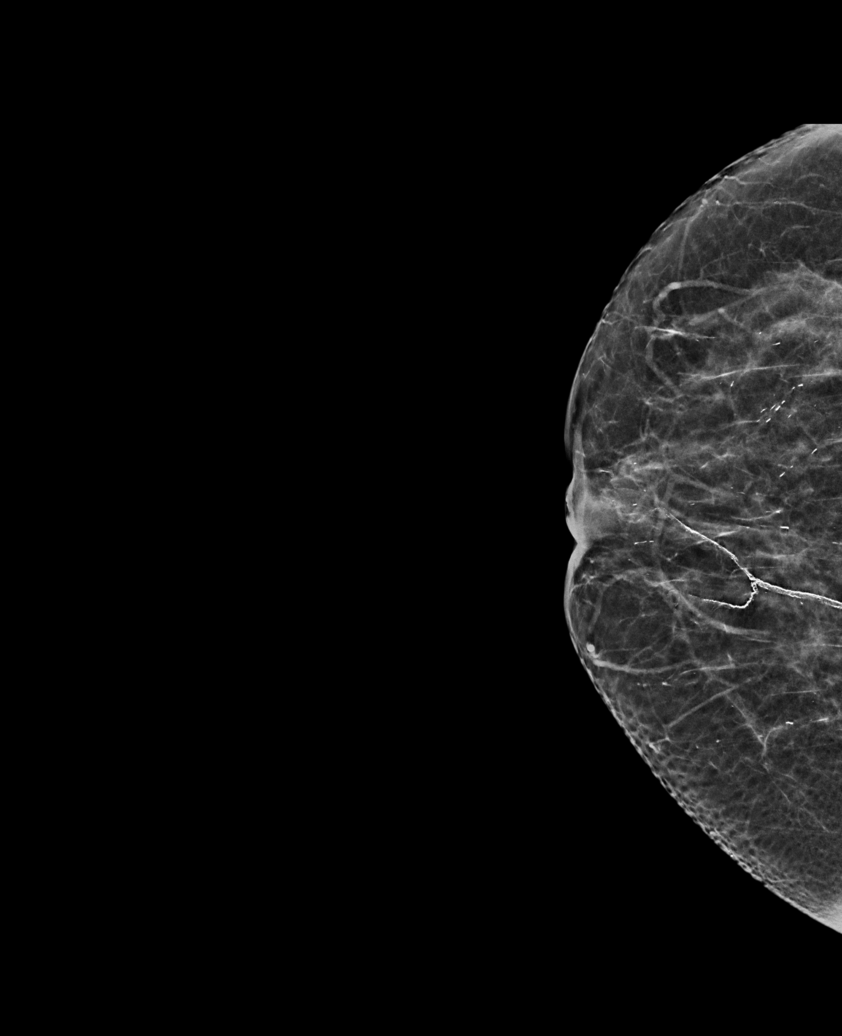

[6 of 36 positions shown; findings below may reference images not displayed]

ACR Breast Density Category b: There are scattered areas of
fibroglandular density.
FINDINGS: There are no findings suspicious for malignancy.
IMPRESSION: No mammographic evidence of malignancy. A result letter of this
screening mammogram will be mailed directly to the patient.

RECOMMENDATION:
Screening mammogram in one year. (Code:51-O-LD2)

BI-RADS CATEGORY  1: Negative.

## 2022-06-04 ENCOUNTER — Other Ambulatory Visit: Payer: Self-pay | Admitting: Physician Assistant

## 2022-06-04 DIAGNOSIS — E785 Hyperlipidemia, unspecified: Secondary | ICD-10-CM

## 2022-06-24 ENCOUNTER — Encounter: Payer: Self-pay | Admitting: Physician Assistant

## 2022-06-24 ENCOUNTER — Ambulatory Visit: Payer: Self-pay | Admitting: Physician Assistant

## 2022-06-24 ENCOUNTER — Other Ambulatory Visit (HOSPITAL_COMMUNITY)
Admission: RE | Admit: 2022-06-24 | Discharge: 2022-06-24 | Disposition: A | Discharge status: Home / Self Care | Payer: Self-pay | Source: Ambulatory Visit | Attending: Physician Assistant | Admitting: Physician Assistant

## 2022-06-24 VITALS — BP 121/73 | HR 97 | Temp 96.6°F | Wt 215.0 lb

## 2022-06-24 DIAGNOSIS — E785 Hyperlipidemia, unspecified: Secondary | ICD-10-CM

## 2022-06-24 DIAGNOSIS — S39012A Strain of muscle, fascia and tendon of lower back, initial encounter: Secondary | ICD-10-CM

## 2022-06-24 DIAGNOSIS — Z789 Other specified health status: Secondary | ICD-10-CM

## 2022-06-24 DIAGNOSIS — R1084 Generalized abdominal pain: Secondary | ICD-10-CM

## 2022-06-24 LAB — LIPID PANEL
Cholesterol: 182 mg/dL (ref 0–200)
HDL: 52 mg/dL (ref >40)
LDL Cholesterol: 115 mg/dL — ABNORMAL HIGH (ref 0–99)
Total CHOL/HDL Ratio: 3.5 RATIO
Triglycerides: 76 mg/dL (ref <150)
VLDL: 15 mg/dL (ref 0–40)

## 2022-06-24 LAB — COMPREHENSIVE METABOLIC PANEL
ALT: 17 U/L (ref 0–44)
AST: 20 U/L (ref 15–41)
Albumin: 4.1 g/dL (ref 3.5–5.0)
Alkaline Phosphatase: 99 U/L (ref 38–126)
Anion gap: 9 (ref 5–15)
BUN: 15 mg/dL (ref 6–20)
CO2: 28 mmol/L (ref 22–32)
Calcium: 8.9 mg/dL (ref 8.9–10.3)
Chloride: 101 mmol/L (ref 98–111)
Creatinine, Ser: 0.71 mg/dL (ref 0.44–1.00)
GFR, Estimated: >60 mL/min (ref >60)
Glucose, Bld: 103 mg/dL — ABNORMAL HIGH (ref 70–99)
Potassium: 4 mmol/L (ref 3.5–5.1)
Sodium: 138 mmol/L (ref 135–145)
Total Bilirubin: 0.6 mg/dL (ref 0.3–1.2)
Total Protein: 7.5 g/dL (ref 6.5–8.1)

## 2022-06-24 MED ORDER — IBUPROFEN 800 MG PO TABS: 800.0000 mg | ORAL_TABLET | Freq: Three times a day (TID) | ORAL | 0 refills | Status: DC | PRN

## 2022-06-24 MED ORDER — SIMVASTATIN 20 MG PO TABS: ORAL_TABLET | ORAL | 6 refills | Status: DC

## 2022-06-24 MED ORDER — CYCLOBENZAPRINE HCL 10 MG PO TABS: 10.0000 mg | ORAL_TABLET | Freq: Three times a day (TID) | ORAL | 0 refills | Status: DC | PRN

## 2022-06-24 NOTE — Progress Notes (Signed)
BP 121/73   Pulse 97   Temp (!) 96.6 F (35.9 C)   Wt 215 lb (97.5 kg)   LMP 06/16/2008   SpO2 99%   BMI 34.70 kg/m    Subjective:    Patient ID: Heidi Gibson del Heidi Gibson, female    DOB: February 14, 1966, 57 y.o.   MRN: [id number]  HPI: Heidi Gibson is a 57 y.o. female presenting on 06/24/2022 for Hyperlipidemia   HPI   Chief Complaint  Patient presents with   Hyperlipidemia    She started on Tuesday with back pain that started after she bent down and had trouble standing up while she was in the shower.   It feels swollen and it hurts.   She took IBU that helped some with the pain but not what she feels is swollen. Marland Kitchen  pAin is in back and shoulders   She works mfg.  The pain hasn't made it difficult for her to work.   Pt thinks she ate something that didn't agree with her on Sunday.  She had lots of diarrhea which is now resolved but her abd is still sore.     Relevant past medical, surgical, family and social history reviewed and updated as indicated. Interim medical history since our last visit reviewed. Allergies and medications reviewed and updated.   Current Outpatient Medications:    simvastatin (ZOCOR) 20 MG tablet, TOME UNA TABLETA POR BOCA AL DORMIR., Disp: 30 tablet, Rfl: 6    Review of Systems  Per HPI unless specifically indicated above     Objective:    BP 121/73   Pulse 97   Temp (!) 96.6 F (35.9 C)   Wt 215 lb (97.5 kg)   LMP 06/16/2008   SpO2 99%   BMI 34.70 kg/m   Wt Readings from Last 3 Encounters:  06/24/22 215 lb (97.5 kg)  02/19/22 222 lb (100.7 kg)  12/31/21 225 lb 4 oz (102.2 kg)    Physical Exam Vitals reviewed.  Constitutional:      General: She is not in acute distress.    Appearance: She is well-developed. She is not toxic-appearing.  HENT:     Head: Normocephalic and atraumatic.     Right Ear: Tympanic membrane, ear canal and external ear normal.     Left Ear: Tympanic membrane, ear canal and  external ear normal.  Eyes:     Extraocular Movements: Extraocular movements intact.     Conjunctiva/sclera: Conjunctivae normal.     Pupils: Pupils are equal, round, and reactive to light.  Cardiovascular:     Rate and Rhythm: Normal rate and regular rhythm.  Pulmonary:     Effort: Pulmonary effort is normal.     Breath sounds: Normal breath sounds.  Abdominal:     General: Bowel sounds are normal.     Palpations: Abdomen is soft. There is no hepatomegaly, splenomegaly, mass or pulsatile mass.     Tenderness: There is generalized abdominal tenderness. There is no guarding or rebound.     Comments: Tenderness is mild, generalized  Musculoskeletal:     Cervical back: Neck supple. Tenderness present. No bony tenderness. Normal range of motion.     Thoracic back: No tenderness or bony tenderness.     Lumbar back: Tenderness present. No bony tenderness. Normal range of motion.       Back:     Right lower leg: No edema.     Left lower leg: No edema.  Comments: Soft tissue tenderness.    Lymphadenopathy:     Cervical: No cervical adenopathy.  Skin:    General: Skin is warm and dry.  Neurological:     Mental Status: She is alert and oriented to person, place, and time.     Motor: No weakness or tremor.     Gait: Gait is intact. Gait normal.     Deep Tendon Reflexes:     Reflex Scores:      Patellar reflexes are 2+ on the right side and 2+ on the left side. Psychiatric:        Behavior: Behavior normal.     Results for orders placed or performed during the hospital encounter of 06/24/22  Lipid panel  Result Value Ref Range   Cholesterol 182 0 - 200 mg/dL   Triglycerides 76 <518 mg/dL   HDL 52 >84 mg/dL   Total CHOL/HDL Ratio 3.5 RATIO   VLDL 15 0 - 40 mg/dL   LDL Cholesterol 166 (H) 0 - 99 mg/dL  Comprehensive metabolic panel  Result Value Ref Range   Sodium 138 135 - 145 mmol/L   Potassium 4.0 3.5 - 5.1 mmol/L   Chloride 101 98 - 111 mmol/L   CO2 28 22 - 32 mmol/L    Glucose, Bld 103 (H) 70 - 99 mg/dL   BUN 15 6 - 20 mg/dL   Creatinine, Ser 0.63 0.44 - 1.00 mg/dL   Calcium 8.9 8.9 - 01.6 mg/dL   Total Protein 7.5 6.5 - 8.1 g/dL   Albumin 4.1 3.5 - 5.0 g/dL   AST 20 15 - 41 U/L   ALT 17 0 - 44 U/L   Alkaline Phosphatase 99 38 - 126 U/L   Total Bilirubin 0.6 0.3 - 1.2 mg/dL   GFR, Estimated >01 >09 mL/min   Anion gap 9 5 - 15      Assessment & Plan:   Encounter Diagnoses  Name Primary?   Hyperlipidemia, unspecified hyperlipidemia type Yes   Back strain, initial encounter    Generalized abdominal pain    Not proficient in [place] language        -pt counseled to RTO if abd not back to normal in a week or sooner if worsens or new symptoms -reviewed labs with pt .   Recommended change to atorvastatin to help lipids but Pt declined  Change to atorvastatin; she wants to continue simvastatin -recommended Heat, ibu, flexeril prn (pt cautioned no driving on flexeril due to sedation)

## 2022-09-01 ENCOUNTER — Other Ambulatory Visit: Payer: Self-pay | Admitting: Physician Assistant

## 2022-09-01 DIAGNOSIS — E785 Hyperlipidemia, unspecified: Secondary | ICD-10-CM

## 2022-09-24 ENCOUNTER — Encounter: Payer: Self-pay | Admitting: Physician Assistant

## 2022-09-24 ENCOUNTER — Other Ambulatory Visit (HOSPITAL_COMMUNITY)
Admission: RE | Admit: 2022-09-24 | Discharge: 2022-09-24 | Disposition: A | Discharge status: Home / Self Care | Payer: Self-pay | Source: Ambulatory Visit | Attending: Physician Assistant | Admitting: Physician Assistant

## 2022-09-24 ENCOUNTER — Ambulatory Visit: Payer: Self-pay | Admitting: Physician Assistant

## 2022-09-24 VITALS — BP 118/76 | HR 100 | Temp 97.3°F | Wt 216.0 lb

## 2022-09-24 DIAGNOSIS — Z789 Other specified health status: Secondary | ICD-10-CM

## 2022-09-24 DIAGNOSIS — E785 Hyperlipidemia, unspecified: Secondary | ICD-10-CM | POA: Insufficient documentation

## 2022-09-24 DIAGNOSIS — Z1239 Encounter for other screening for malignant neoplasm of breast: Secondary | ICD-10-CM

## 2022-09-24 LAB — HEPATIC FUNCTION PANEL
ALT: 18 U/L (ref 0–44)
AST: 19 U/L (ref 15–41)
Albumin: 4 g/dL (ref 3.5–5.0)
Alkaline Phosphatase: 99 U/L (ref 38–126)
Bilirubin, Direct: 0.1 mg/dL (ref 0.0–0.2)
Indirect Bilirubin: 0.7 mg/dL (ref 0.3–0.9)
Total Bilirubin: 0.8 mg/dL (ref 0.3–1.2)
Total Protein: 7.1 g/dL (ref 6.5–8.1)

## 2022-09-24 LAB — LIPID PANEL
Cholesterol: 174 mg/dL (ref 0–200)
HDL: 56 mg/dL (ref >40)
LDL Cholesterol: 102 mg/dL — ABNORMAL HIGH (ref 0–99)
Total CHOL/HDL Ratio: 3.1 RATIO
Triglycerides: 79 mg/dL (ref <150)
VLDL: 16 mg/dL (ref 0–40)

## 2022-09-24 MED ORDER — SIMVASTATIN 20 MG PO TABS: ORAL_TABLET | ORAL | 6 refills | Status: DC

## 2022-09-24 NOTE — Patient Instructions (Addendum)
Update enrollment/ Actualizar inscripcin

## 2022-09-24 NOTE — Progress Notes (Unsigned)
   BP 118/76   Pulse 100   Temp (!) 97.3 F (36.3 C)   Wt 216 lb (98 kg)   LMP 06/16/2008   SpO2 96%   BMI 34.86 kg/m    Subjective:    Patient ID: Heidi Gibson, female    DOB: December 07, 1965, 57 y.o.   MRN: [id number]  HPI: Heidi Gibson is a 57 y.o. female presenting on 09/24/2022 for No chief complaint on file.   HPI  Relevant past medical, surgical, family and social history reviewed and updated as indicated. Interim medical history since our last visit reviewed. Allergies and medications reviewed and updated.    Current Outpatient Medications:    simvastatin (ZOCOR) 20 MG tablet, TOME UNA TABLETA POR BOCA AL DORMIR., Disp: 30 tablet, Rfl: 6    Review of Systems  Per HPI unless specifically indicated above     Objective:    BP 118/76   Pulse 100   Temp (!) 97.3 F (36.3 C)   Wt 216 lb (98 kg)   LMP 06/16/2008   SpO2 96%   BMI 34.86 kg/m   Wt Readings from Last 3 Encounters:  09/24/22 216 lb (98 kg)  06/24/22 215 lb (97.5 kg)  02/19/22 222 lb (100.7 kg)    Physical Exam    Results for orders placed or performed during the hospital encounter of 09/24/22  Hepatic function panel  Result Value Ref Range   Total Protein 7.1 6.5 - 8.1 g/dL   Albumin 4.0 3.5 - 5.0 g/dL   AST 19 15 - 41 U/L   ALT 18 0 - 44 U/L   Alkaline Phosphatase 99 38 - 126 U/L   Total Bilirubin 0.8 0.3 - 1.2 mg/dL   Bilirubin, Direct 0.1 0.0 - 0.2 mg/dL   Indirect Bilirubin 0.7 0.3 - 0.9 mg/dL  Lipid panel  Result Value Ref Range   Cholesterol 174 0 - 200 mg/dL   Triglycerides 79 <549 mg/dL   HDL 56 >82 mg/dL   Total CHOL/HDL Ratio 3.1 RATIO   VLDL 16 0 - 40 mg/dL   LDL Cholesterol 641 (H) 0 - 99 mg/dL        Assessment & Plan:     Rev labs Mammo Enrollment

## 2022-10-29 ENCOUNTER — Telehealth: Payer: Self-pay

## 2022-10-29 NOTE — Telephone Encounter (Signed)
Telephoned patient at mobile number using interpreter [phone]. Left a voice message with [facility] (scholarship) contact information.

## 2022-12-15 ENCOUNTER — Ambulatory Visit: Payer: Self-pay | Admitting: Physician Assistant

## 2022-12-15 ENCOUNTER — Other Ambulatory Visit (HOSPITAL_COMMUNITY)
Admission: RE | Admit: 2022-12-15 | Discharge: 2022-12-15 | Disposition: A | Discharge status: Home / Self Care | Payer: Self-pay | Source: Ambulatory Visit | Attending: Physician Assistant | Admitting: Physician Assistant

## 2022-12-15 ENCOUNTER — Other Ambulatory Visit: Payer: Self-pay | Admitting: Physician Assistant

## 2022-12-15 ENCOUNTER — Encounter: Payer: Self-pay | Admitting: Physician Assistant

## 2022-12-15 VITALS — BP 118/74 | HR 100 | Temp 98.2°F

## 2022-12-15 DIAGNOSIS — M25562 Pain in left knee: Secondary | ICD-10-CM | POA: Insufficient documentation

## 2022-12-15 DIAGNOSIS — M549 Dorsalgia, unspecified: Secondary | ICD-10-CM

## 2022-12-15 DIAGNOSIS — Z789 Other specified health status: Secondary | ICD-10-CM

## 2022-12-15 DIAGNOSIS — M25572 Pain in left ankle and joints of left foot: Secondary | ICD-10-CM

## 2022-12-15 DIAGNOSIS — Z1231 Encounter for screening mammogram for malignant neoplasm of breast: Secondary | ICD-10-CM

## 2022-12-15 LAB — CBC WITH DIFFERENTIAL/PLATELET
Abs Immature Granulocytes: 0.01 10*3/uL (ref 0.00–0.07)
Basophils Absolute: 0 10*3/uL (ref 0.0–0.1)
Basophils Relative: 1 %
Eosinophils Absolute: 0.1 10*3/uL (ref 0.0–0.5)
Eosinophils Relative: 1 %
HCT: 40.5 % (ref 36.0–46.0)
Hemoglobin: 13.4 g/dL (ref 12.0–15.0)
Immature Granulocytes: 0 %
Lymphocytes Relative: 33 %
Lymphs Abs: 1.6 10*3/uL (ref 0.7–4.0)
MCH: 31.4 pg (ref 26.0–34.0)
MCHC: 33.1 g/dL (ref 30.0–36.0)
MCV: 94.8 fL (ref 80.0–100.0)
Monocytes Absolute: 0.3 10*3/uL (ref 0.1–1.0)
Monocytes Relative: 7 %
Neutro Abs: 2.7 10*3/uL (ref 1.7–7.7)
Neutrophils Relative %: 58 %
Platelets: 244 10*3/uL (ref 150–400)
RBC: 4.27 MIL/uL (ref 3.87–5.11)
RDW: 12.3 % (ref 11.5–15.5)
WBC: 4.7 10*3/uL (ref 4.0–10.5)
nRBC: 0 % (ref 0.0–0.2)

## 2022-12-15 LAB — SEDIMENTATION RATE: Sed Rate: 16 mm/hr (ref 0–22)

## 2022-12-15 MED ORDER — PREDNISONE 20 MG PO TABS: 20.0000 mg | ORAL_TABLET | Freq: Two times a day (BID) | ORAL | 0 refills | Status: DC

## 2022-12-15 NOTE — Progress Notes (Signed)
LMP 06/16/2008    Subjective:    Patient ID: Byrd Hesselbach del Kathee Polite, female    DOB: 12-13-1965, 57 y.o.   MRN: [id number]  HPI: Heidi Gibson is a 57 y.o. female presenting on 12/15/2022 for No chief complaint on file.   HPI  Pt walked into office this morning c/o pain and was worked into the schedule.  She says Left knee hurts starting yestday and her back hurts.  The Left knee hurt a little bit on Saturday but was much worse on Sunday.  She has had No injury.  She says the Pain gradually worsened during the day.  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    She has No dysuria.  No self-treat with OTC analgesics.  She did apply ice pack to the knee.  Walking makes the pain worse.   She is wearing knee wrap now which she says helps a little bit.    Pain started in the knee and then progressed up to the back.  She says her Eating and moving bowels is normal.  No incontinence.  No numbness or tingling.      Relevant past medical, surgical, family and social history reviewed and updated as indicated. Interim medical history since our last visit reviewed. Allergies and medications reviewed and updated.   Current Outpatient Medications:    simvastatin (ZOCOR) 20 MG tablet, TOME UNA TABLETA POR BOCA AL DORMIR., Disp: 30 tablet, Rfl: 6   Review of Systems  Per HPI unless specifically indicated above     Objective:     Vitals:   12/15/22 1006  BP: 118/74  Pulse: 100  Temp: 98.2 F (36.8 C)  SpO2: 99%      LMP 06/16/2008   Wt Readings from Last 3 Encounters:  09/24/22 216 lb (98 kg)  06/24/22 215 lb (97.5 kg)  02/19/22 222 lb (100.7 kg)    Physical Exam Vitals reviewed.  Constitutional:      General: She is not in acute distress.    Appearance: She is well-developed. She is not toxic-appearing.  HENT:     Head: Normocephalic and atraumatic.  Cardiovascular:     Rate and Rhythm: Normal rate and regular  rhythm.  Pulmonary:     Effort: Pulmonary effort is normal.     Breath sounds: Normal breath sounds.  Abdominal:     General: Bowel sounds are normal.     Palpations: Abdomen is soft. There is no mass.     Tenderness: There is no abdominal tenderness.  Musculoskeletal:     Cervical back: Neck supple. Tenderness present. No swelling, deformity, spasms or bony tenderness.     Thoracic back: Tenderness present. No swelling, deformity, spasms or bony tenderness.     Lumbar back: Tenderness present. No swelling, deformity, spasms or bony tenderness. Negative right straight leg raise test and negative left straight leg raise test.     Left knee: No swelling, deformity, effusion, erythema, ecchymosis or bony tenderness. Tenderness present.     Right lower leg: No swelling. No edema.     Left lower leg: No swelling. No edema.     Left ankle: No swelling, deformity, ecchymosis or lacerations. Tenderness present.     Left foot: Normal capillary refill. No swelling or deformity. Normal pulse.     Comments: -Pt entire back tender with no point tenderness -Left lateral ankle tenderness on anterior aspect of the malleolus  Lymphadenopathy:  Cervical: No cervical adenopathy.  Skin:    General: Skin is warm and dry.  Neurological:     Mental Status: She is alert and oriented to person, place, and time.     Gait: Gait normal.  Psychiatric:        Mood and Affect: Mood is anxious.        Behavior: Behavior normal. Behavior is cooperative.              Assessment & Plan:    Encounter Diagnoses  Name Primary?   Acute pain of left knee Yes   Acute back pain, unspecified back location, unspecified back pain laterality    Acute left ankle pain    Not proficient in English language      -Mammogram appointment is scheduled for her (it was ordered in April and hadn't been scheduled yet) -rx Prednisone and she has flexeril that she can take.  She is reminded to avoid driving on the  flexeril as it can cause sedation -pt to get Labs drawn prior to starting meds -she is given note to RTW next Monday.  She is to contact this office if she is still having pain next Monday.  She is to call sooner for worsening or new symptoms

## 2023-01-05 ENCOUNTER — Ambulatory Visit (HOSPITAL_COMMUNITY)
Admission: RE | Admit: 2023-01-05 | Discharge: 2023-01-05 | Disposition: A | Discharge status: Home / Self Care | Payer: Self-pay | Source: Ambulatory Visit | Attending: Physician Assistant | Admitting: Physician Assistant

## 2023-01-05 DIAGNOSIS — Z1231 Encounter for screening mammogram for malignant neoplasm of breast: Secondary | ICD-10-CM | POA: Insufficient documentation

## 2023-01-22 ENCOUNTER — Telehealth: Payer: Self-pay

## 2023-01-22 ENCOUNTER — Encounter: Payer: Self-pay | Admitting: Physician Assistant

## 2023-01-22 ENCOUNTER — Ambulatory Visit: Payer: Self-pay | Admitting: Physician Assistant

## 2023-01-22 ENCOUNTER — Ambulatory Visit (HOSPITAL_COMMUNITY)
Admission: RE | Admit: 2023-01-22 | Discharge: 2023-01-22 | Disposition: A | Discharge status: Home / Self Care | Payer: Self-pay | Source: Ambulatory Visit | Attending: Physician Assistant | Admitting: Physician Assistant

## 2023-01-22 VITALS — BP 122/82 | HR 96 | Temp 97.2°F | Wt 219.8 lb

## 2023-01-22 DIAGNOSIS — R6884 Jaw pain: Secondary | ICD-10-CM

## 2023-01-22 DIAGNOSIS — M25562 Pain in left knee: Secondary | ICD-10-CM | POA: Insufficient documentation

## 2023-01-22 DIAGNOSIS — Z789 Other specified health status: Secondary | ICD-10-CM

## 2023-01-22 MED ORDER — NAPROXEN SODIUM 220 MG PO TABS: 220.0000 mg | ORAL_TABLET | Freq: Two times a day (BID) | ORAL | Status: AC | PRN

## 2023-01-22 MED ORDER — AMOXICILLIN 500 MG PO CAPS: 500.0000 mg | ORAL_CAPSULE | Freq: Three times a day (TID) | ORAL | 0 refills | Status: AC

## 2023-01-22 NOTE — Telephone Encounter (Signed)
Received urgent dental referral from [facility] today to [facility]. [facility] asked that I call Dr. Maryjane Hurter dental office for an appointment. Attempted x 4 this morning and there is just a busy signal. Website states closed until next week,  but will continue to attempt to call.  I did call [facility] to let them know I have been attempting to reach the dental office.  Plan: will continue to attempt to reach to determine an appointment for the patient for dental services with Dr. Kaleen Odea.    Francee Nodal RN [employer]

## 2023-01-22 NOTE — Progress Notes (Signed)
BP 122/82   Pulse 96   Temp (!) 97.2 F (36.2 C)   Wt 219 lb 12 oz (99.7 kg)   LMP 06/16/2008   SpO2 98%   BMI 35.47 kg/m    Subjective:    Patient ID: Heidi Gibson, female    DOB: August 16, 1965, 57 y.o.   MRN: [id number]  HPI: Heidi Gibson is a 57 y.o. female presenting on 01/22/2023 for Ear Pain (L ear pain since Monday. Pt took some left over cyclobenzaprine and tylenol for pain which helped. Pt states Heidi Gibson was unable to open her mouth and her L jaw was swelling. Pt reports her ear feeling wet.)   HPI  Chief Complaint  Patient presents with   Ear Pain    L ear pain since Monday. Pt took some left over cyclobenzaprine and tylenol for pain which helped. Pt states Heidi Gibson was unable to open her mouth and her L jaw was swelling. Pt reports her ear feeling wet.    Pain as above. it hurts to eat/bite down.  Heidi Gibson says Heidi Gibson has No pain in any specific tooth.  No right ear pain.  No fever, congestion, cough.   No n/v.   Heidi Gibson had face swelling yesterday but it went down with ice pack.   Heidi Gibson also c/o the left knee pain.  Heidi Gibson was seen for this last month.  The prednisone last month helped a lot.    It hurts about 2 days/week.  Heidi Gibson stands at work a long time in the same place.     Relevant past medical, surgical, family and social history reviewed and updated as indicated. Interim medical history since our last visit reviewed. Allergies and medications reviewed and updated.  Review of Systems  Per HPI unless specifically indicated above     Objective:    BP 122/82   Pulse 96   Temp (!) 97.2 F (36.2 C)   Wt 219 lb 12 oz (99.7 kg)   LMP 06/16/2008   SpO2 98%   BMI 35.47 kg/m   Wt Readings from Last 3 Encounters:  01/22/23 219 lb 12 oz (99.7 kg)  09/24/22 216 lb (98 kg)  06/24/22 215 lb (97.5 kg)    Physical Exam Nursing note reviewed.  Constitutional:      General: Heidi Gibson is not in acute distress.    Appearance: Heidi Gibson is not toxic-appearing.   HENT:     Head: Normocephalic and atraumatic.     Jaw: Pain on movement present. No trismus.     Salivary Glands: Right salivary gland is not diffusely enlarged or tender. Left salivary gland is not diffusely enlarged or tender.      Comments: Area of pain left jaw area    Right Ear: Tympanic membrane, ear canal and external ear normal.     Left Ear: Tympanic membrane, ear canal and external ear normal. No swelling. No mastoid tenderness.     Mouth/Throat:     Mouth: Mucous membranes are moist.     Dentition: Abnormal dentition. Dental caries present. No dental tenderness or dental abscesses.  Cardiovascular:     Rate and Rhythm: Normal rate and regular rhythm.  Pulmonary:     Effort: Pulmonary effort is normal. No respiratory distress.     Breath sounds: Normal breath sounds. No stridor. No wheezing or rhonchi.  Musculoskeletal:     Left knee: Crepitus present. No erythema. Normal range of motion. Tenderness present over the medial joint line.  Skin:    General: Skin is warm and dry.  Neurological:     Mental Status: Heidi Gibson is alert and oriented to person, place, and time.  Psychiatric:        Behavior: Behavior normal.            Assessment & Plan:    Encounter Diagnoses  Name Primary?   Jaw pain Yes   Left knee pain, unspecified chronicity    Not proficient in English language      -Rx amoxil and dentist for likely dental etiology -xray and aleve for knee -Heidi Gibson is given cafa application and RTW note

## 2023-01-26 ENCOUNTER — Telehealth: Payer: Self-pay

## 2023-02-03 ENCOUNTER — Other Ambulatory Visit: Payer: Self-pay | Admitting: Physician Assistant

## 2023-02-03 DIAGNOSIS — M1712 Unilateral primary osteoarthritis, left knee: Secondary | ICD-10-CM

## 2023-02-03 DIAGNOSIS — M25562 Pain in left knee: Secondary | ICD-10-CM

## 2023-03-02 ENCOUNTER — Other Ambulatory Visit: Payer: Self-pay | Admitting: Physician Assistant

## 2023-03-02 DIAGNOSIS — E785 Hyperlipidemia, unspecified: Secondary | ICD-10-CM

## 2023-03-26 ENCOUNTER — Other Ambulatory Visit: Payer: Self-pay | Admitting: Physician Assistant

## 2023-03-26 ENCOUNTER — Other Ambulatory Visit (HOSPITAL_COMMUNITY)
Admission: RE | Admit: 2023-03-26 | Discharge: 2023-03-26 | Disposition: A | Discharge status: Home / Self Care | Payer: Self-pay | Source: Ambulatory Visit | Attending: Physician Assistant | Admitting: Physician Assistant

## 2023-03-26 ENCOUNTER — Ambulatory Visit: Payer: Self-pay | Admitting: Physician Assistant

## 2023-03-26 ENCOUNTER — Encounter: Payer: Self-pay | Admitting: Physician Assistant

## 2023-03-26 VITALS — BP 131/85 | HR 80 | Temp 97.3°F | Wt 221.8 lb

## 2023-03-26 DIAGNOSIS — Z789 Other specified health status: Secondary | ICD-10-CM

## 2023-03-26 DIAGNOSIS — Z1211 Encounter for screening for malignant neoplasm of colon: Secondary | ICD-10-CM

## 2023-03-26 DIAGNOSIS — E785 Hyperlipidemia, unspecified: Secondary | ICD-10-CM | POA: Insufficient documentation

## 2023-03-26 LAB — LIPID PANEL
Cholesterol: 170 mg/dL (ref 0–200)
HDL: 59 mg/dL (ref >40)
LDL Cholesterol: 95 mg/dL (ref 0–99)
Total CHOL/HDL Ratio: 2.9 {ratio}
Triglycerides: 80 mg/dL (ref <150)
VLDL: 16 mg/dL (ref 0–40)

## 2023-03-26 LAB — HEPATIC FUNCTION PANEL
ALT: 17 U/L (ref 0–44)
AST: 20 U/L (ref 15–41)
Albumin: 3.9 g/dL (ref 3.5–5.0)
Alkaline Phosphatase: 95 U/L (ref 38–126)
Bilirubin, Direct: 0.2 mg/dL (ref 0.0–0.2)
Indirect Bilirubin: 0.6 mg/dL (ref 0.3–0.9)
Total Bilirubin: 0.8 mg/dL (ref 0.3–1.2)
Total Protein: 6.9 g/dL (ref 6.5–8.1)

## 2023-03-26 MED ORDER — SIMVASTATIN 20 MG PO TABS: ORAL_TABLET | ORAL | 6 refills | Status: DC

## 2023-03-26 NOTE — Progress Notes (Signed)
BP 131/85   Pulse 80   Temp (!) 97.3 F (36.3 C)   Wt 221 lb 12 oz (100.6 kg)   LMP 06/16/2008   SpO2 97%   BMI 35.79 kg/m    Subjective:    Patient ID: Heidi Gibson, female    DOB: 1965-10-19, 57 y.o.   MRN: [id number]  HPI: Heidi Gibson is a 57 y.o. female presenting on 03/26/2023 for Hyperlipidemia   HPI  Chief Complaint  Patient presents with   Hyperlipidemia    Her knee is doing good She has back pain some days and not others. She says she is doing well and she has no complaints.   Relevant past medical, surgical, family and social history reviewed and updated as indicated. Interim medical history since our last visit reviewed. Allergies and medications reviewed and updated.   Current Outpatient Medications:    simvastatin (ZOCOR) 20 MG tablet, TOME UNA TABLETA POR BOCA AL DORMIR., Disp: 30 tablet, Rfl: 6   naproxen sodium (ALEVE) 220 MG tablet, Take 1 tablet (220 mg total) by mouth 2 (two) times daily as needed. (Patient not taking: Reported on 03/26/2023), Disp: , Rfl:     Review of Systems  Per HPI unless specifically indicated above     Objective:    BP 131/85   Pulse 80   Temp (!) 97.3 F (36.3 C)   Wt 221 lb 12 oz (100.6 kg)   LMP 06/16/2008   SpO2 97%   BMI 35.79 kg/m   Wt Readings from Last 3 Encounters:  03/26/23 221 lb 12 oz (100.6 kg)  01/22/23 219 lb 12 oz (99.7 kg)  09/24/22 216 lb (98 kg)    Physical Exam Vitals reviewed.  Constitutional:      General: She is not in acute distress.    Appearance: She is well-developed. She is obese. She is not toxic-appearing.  HENT:     Head: Normocephalic and atraumatic.     Right Ear: Tympanic membrane, ear canal and external ear normal.     Left Ear: Tympanic membrane, ear canal and external ear normal.  Eyes:     Extraocular Movements: Extraocular movements intact.     Conjunctiva/sclera: Conjunctivae normal.     Pupils: Pupils are equal, round, and  reactive to light.  Cardiovascular:     Rate and Rhythm: Normal rate and regular rhythm.  Pulmonary:     Effort: Pulmonary effort is normal.     Breath sounds: Normal breath sounds.  Abdominal:     General: Bowel sounds are normal.     Palpations: Abdomen is soft. There is no mass.     Tenderness: There is no abdominal tenderness.  Musculoskeletal:     Cervical back: Neck supple.     Right lower leg: No edema.     Left lower leg: No edema.  Lymphadenopathy:     Cervical: No cervical adenopathy.  Skin:    General: Skin is warm and dry.  Neurological:     Mental Status: She is alert and oriented to person, place, and time.     Motor: No weakness or tremor.     Coordination: Romberg sign negative.     Gait: Gait is intact.  Psychiatric:        Attention and Perception: Attention normal.        Mood and Affect: Mood normal.        Speech: Speech normal.  Behavior: Behavior normal. Behavior is cooperative.     Results for orders placed or performed during the hospital encounter of 03/26/23  Hepatic function panel  Result Value Ref Range   Total Protein 6.9 6.5 - 8.1 g/dL   Albumin 3.9 3.5 - 5.0 g/dL   AST 20 15 - 41 U/L   ALT 17 0 - 44 U/L   Alkaline Phosphatase 95 38 - 126 U/L   Total Bilirubin 0.8 0.3 - 1.2 mg/dL   Bilirubin, Direct 0.2 0.0 - 0.2 mg/dL   Indirect Bilirubin 0.6 0.3 - 0.9 mg/dL  Lipid panel  Result Value Ref Range   Cholesterol 170 0 - 200 mg/dL   Triglycerides 80 <578 mg/dL   HDL 59 >46 mg/dL   Total CHOL/HDL Ratio 2.9 RATIO   VLDL 16 0 - 40 mg/dL   LDL Cholesterol 95 0 - 99 mg/dL      Assessment & Plan:   Encounter Diagnoses  Name Primary?   Hyperlipidemia, unspecified hyperlipidemia type Yes   Not proficient in [place] language       -reviewed labs with pt -pt to continue simvastatin -pt encouraged to continue healthy, lowfat diet and regular exercise -she will follow up in 4 months.  She is to contact office sooner prn

## 2023-07-14 ENCOUNTER — Other Ambulatory Visit: Payer: Self-pay | Admitting: Physician Assistant

## 2023-07-14 DIAGNOSIS — E785 Hyperlipidemia, unspecified: Secondary | ICD-10-CM

## 2023-07-30 ENCOUNTER — Other Ambulatory Visit (HOSPITAL_COMMUNITY)
Admission: RE | Admit: 2023-07-30 | Discharge: 2023-07-30 | Disposition: A | Discharge status: Home / Self Care | Payer: Self-pay | Source: Ambulatory Visit | Attending: Physician Assistant | Admitting: Physician Assistant

## 2023-07-30 ENCOUNTER — Encounter: Payer: Self-pay | Admitting: Physician Assistant

## 2023-07-30 ENCOUNTER — Ambulatory Visit: Payer: Self-pay | Admitting: Physician Assistant

## 2023-07-30 VITALS — BP 123/73 | HR 86 | Temp 96.6°F | Wt 217.0 lb

## 2023-07-30 DIAGNOSIS — Z789 Other specified health status: Secondary | ICD-10-CM

## 2023-07-30 DIAGNOSIS — Z1211 Encounter for screening for malignant neoplasm of colon: Secondary | ICD-10-CM

## 2023-07-30 DIAGNOSIS — H5712 Ocular pain, left eye: Secondary | ICD-10-CM

## 2023-07-30 DIAGNOSIS — E785 Hyperlipidemia, unspecified: Secondary | ICD-10-CM

## 2023-07-30 LAB — COMPREHENSIVE METABOLIC PANEL
ALT: 17 U/L (ref 0–44)
AST: 21 U/L (ref 15–41)
Albumin: 3.9 g/dL (ref 3.5–5.0)
Alkaline Phosphatase: 97 U/L (ref 38–126)
Anion gap: 8 (ref 5–15)
BUN: 14 mg/dL (ref 6–20)
CO2: 27 mmol/L (ref 22–32)
Calcium: 9 mg/dL (ref 8.9–10.3)
Chloride: 104 mmol/L (ref 98–111)
Creatinine, Ser: 0.63 mg/dL (ref 0.44–1.00)
GFR, Estimated: >60 mL/min (ref >60)
Glucose, Bld: 97 mg/dL (ref 70–99)
Potassium: 3.9 mmol/L (ref 3.5–5.1)
Sodium: 139 mmol/L (ref 135–145)
Total Bilirubin: 0.7 mg/dL (ref 0.0–1.2)
Total Protein: 7.2 g/dL (ref 6.5–8.1)

## 2023-07-30 LAB — LIPID PANEL
Cholesterol: 193 mg/dL (ref 0–200)
HDL: 64 mg/dL (ref >40)
LDL Cholesterol: 111 mg/dL — ABNORMAL HIGH (ref 0–99)
Total CHOL/HDL Ratio: 3 {ratio}
Triglycerides: 91 mg/dL (ref <150)
VLDL: 18 mg/dL (ref 0–40)

## 2023-07-30 NOTE — Progress Notes (Signed)
BP 123/73   Pulse 86   Temp (!) 96.6 F (35.9 C)   Wt 217 lb (98.4 kg)   LMP 06/16/2008   SpO2 99%   BMI 35.02 kg/m    Subjective:    Patient ID: Heidi Gibson, female    DOB: 1965-07-20, 58 y.o.   MRN: [id number]  HPI: Heidi Gibson is a 58 y.o. female presenting on 07/30/2023 for Hyperlipidemia and Eye Pain (L inner eye pain since Tuesday)   HPI   Chief Complaint  Patient presents with   Hyperlipidemia   Eye Pain    L inner eye pain since Tuesday     Pt is in today for routine follow up dyslipidemia.    OS hurts medially x 2 days.  And watering.  Small aount d/c.  No visual changes.    She doesn't wear contacts or glasses.  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    Onset eye issues started gradually.      Relevant past medical, surgical, family and social history reviewed and updated as indicated. Interim medical history since our last visit reviewed. Allergies and medications reviewed and updated.   Current Outpatient Medications:    simvastatin (ZOCOR) 20 MG tablet, TOME UNA TABLETA POR BOCA AL DORMIR., Disp: 30 tablet, Rfl: 6   naproxen sodium (ALEVE) 220 MG tablet, Take 1 tablet (220 mg total) by mouth 2 (two) times daily as needed. (Patient not taking: Reported on 07/30/2023), Disp: , Rfl:    Review of Systems  Per HPI unless specifically indicated above     Objective:    BP 123/73   Pulse 86   Temp (!) 96.6 F (35.9 C)   Wt 217 lb (98.4 kg)   LMP 06/16/2008   SpO2 99%   BMI 35.02 kg/m   Wt Readings from Last 3 Encounters:  07/30/23 217 lb (98.4 kg)  03/26/23 221 lb 12 oz (100.6 kg)  01/22/23 219 lb 12 oz (99.7 kg)    Vision Screening   Right eye Left eye Both eyes  Without correction 20/50 20/50 20/50   With correction        Physical Exam Constitutional:      General: She is not in acute distress.    Appearance: She is not toxic-appearing.  HENT:     Head: Normocephalic  and atraumatic.  Eyes:     General: Lids are normal. Lids are everted, no foreign bodies appreciated.        Left eye: No foreign body, discharge or hordeolum.     Extraocular Movements: Extraocular movements intact.     Conjunctiva/sclera: Conjunctivae normal.     Pupils: Pupils are equal, round, and reactive to light.     Left eye: No corneal abrasion or fluorescein uptake.     Comments: 1 gtt proparacaine prior to fluorescein  Cardiovascular:     Rate and Rhythm: Normal rate and regular rhythm.  Pulmonary:     Effort: Pulmonary effort is normal. No respiratory distress.     Breath sounds: Normal breath sounds. No wheezing.  Skin:    General: Skin is warm and dry.  Neurological:     Mental Status: She is alert and oriented to person, place, and time.  Psychiatric:        Behavior: Behavior normal.     Results for orders placed or performed during the hospital encounter of 07/30/23  Comprehensive metabolic panel   Collection  Time: 07/30/23  8:39 AM  Result Value Ref Range   Sodium 139 135 - 145 mmol/L   Potassium 3.9 3.5 - 5.1 mmol/L   Chloride 104 98 - 111 mmol/L   CO2 27 22 - 32 mmol/L   Glucose, Bld 97 70 - 99 mg/dL   BUN 14 6 - 20 mg/dL   Creatinine, Ser 1.61 0.44 - 1.00 mg/dL   Calcium 9.0 8.9 - 09.6 mg/dL   Total Protein 7.2 6.5 - 8.1 g/dL   Albumin 3.9 3.5 - 5.0 g/dL   AST 21 15 - 41 U/L   ALT 17 0 - 44 U/L   Alkaline Phosphatase 97 38 - 126 U/L   Total Bilirubin 0.7 0.0 - 1.2 mg/dL   GFR, Estimated >04 >54 mL/min   Anion gap 8 5 - 15  Lipid panel   Collection Time: 07/30/23  8:39 AM  Result Value Ref Range   Cholesterol 193 0 - 200 mg/dL   Triglycerides 91 <098 mg/dL   HDL 64 >11 mg/dL   Total CHOL/HDL Ratio 3.0 RATIO   VLDL 18 0 - 40 mg/dL   LDL Cholesterol 914 (H) 0 - 99 mg/dL      Assessment & Plan:   Encounter Diagnoses  Name Primary?   Hyperlipidemia, unspecified hyperlipidemia type Yes   Pain of left eye    Screening for colon cancer    Not  proficient in English language      Dyslipidemia -reviewed labs with pt -pt to continue simvastatin -encouraged lowfat diet  HCM -pt reminded to return her FIT test for colon cancer screening  Eye complaint -warm compresses and zaditor -pt recommended to get routine vision check  -pt to follow up 4 months.  She is to contact office sooner prn

## 2023-07-30 NOTE — Patient Instructions (Addendum)
Apply warm compresses 3 or 4 times daily for the next 3 or 4 days.   Zaditor eye drops- one drop twice daily.  Aplique compresas tibias 3 o 4 veces al [employer] prximos 3 o 4 das.   [place] Zaditor: Physicist, medical.

## 2023-09-14 ENCOUNTER — Other Ambulatory Visit: Payer: Self-pay | Admitting: Physician Assistant

## 2023-09-14 DIAGNOSIS — Z1211 Encounter for screening for malignant neoplasm of colon: Secondary | ICD-10-CM

## 2023-09-14 LAB — POC FIT TEST STOOL: Fecal Occult Blood: NEGATIVE

## 2023-11-02 ENCOUNTER — Encounter: Payer: Self-pay | Admitting: Physician Assistant

## 2023-11-02 ENCOUNTER — Ambulatory Visit: Payer: Self-pay | Admitting: Physician Assistant

## 2023-11-02 VITALS — BP 133/82 | HR 111 | Temp 97.8°F

## 2023-11-02 DIAGNOSIS — K0889 Other specified disorders of teeth and supporting structures: Secondary | ICD-10-CM

## 2023-11-02 DIAGNOSIS — Z789 Other specified health status: Secondary | ICD-10-CM

## 2023-11-02 MED ORDER — AMOXICILLIN 500 MG PO CAPS: 500.0000 mg | ORAL_CAPSULE | Freq: Three times a day (TID) | ORAL | 0 refills | Status: AC

## 2023-11-02 NOTE — Progress Notes (Signed)
   BP 133/82   Pulse (!) 111   Temp 97.8 F (36.6 C)   LMP 06/16/2008   SpO2 97%    Subjective:    Patient ID: Heidi Gibson, female    DOB: 02-29-1966, 58 y.o.   MRN: [id number]  HPI: Heidi Gibson is a 58 y.o. female presenting on 11/02/2023 for No chief complaint on file.   HPI  Mouth started bothering her yesterday she took ibu 600 this morning and it helped some.    She says she was fine on Saturday.  No fever.  She is only able to eat a little bit.   She says pain mostly left upper molar area  She last went to dentist earlier this year- in April.   She has a cleaning in September.    Relevant past medical, surgical, family and social history reviewed and updated as indicated. Interim medical history since our last visit reviewed. Allergies and medications reviewed and updated.   Current Outpatient Medications:    simvastatin  (ZOCOR ) 20 MG tablet, TOME UNA TABLETA POR BOCA AL DORMIR., Disp: 30 tablet, Rfl: 6   naproxen  sodium (ALEVE ) 220 MG tablet, Take 1 tablet (220 mg total) by mouth 2 (two) times daily as needed. (Patient not taking: Reported on 07/30/2023), Disp: , Rfl:     Review of Systems  Per HPI unless specifically indicated above     Objective:     BP 133/82   Pulse (!) 111   Temp 97.8 F (36.6 C)   LMP 06/16/2008   SpO2 97%   Wt Readings from Last 3 Encounters:  07/30/23 217 lb (98.4 kg)  03/26/23 221 lb 12 oz (100.6 kg)  01/22/23 219 lb 12 oz (99.7 kg)    Physical Exam Constitutional:      General: She is not in acute distress.    Appearance: She is not ill-appearing or toxic-appearing.  HENT:     Head: Normocephalic and atraumatic.     Jaw: There is normal jaw occlusion. Pain on movement present. No trismus, tenderness or swelling.     Mouth/Throat:     Mouth: Mucous membranes are moist.     Dentition: No gingival swelling or dental abscesses.     Comments: There is no facial swelling or erythema.  Pain  left upper molar/jaw area Pulmonary:     Effort: Pulmonary effort is normal. No respiratory distress.  Skin:    General: Skin is warm and dry.  Neurological:     Mental Status: She is alert and oriented to person, place, and time.  Psychiatric:        Behavior: Behavior normal.           Assessment & Plan:    Encounter Diagnoses  Name Primary?   Dentalgia Yes   Not proficient in English language       -rx amoxil  -she has refill on IBU 600 -refer to dentist -f/u June as scheduled

## 2023-11-12 ENCOUNTER — Other Ambulatory Visit: Payer: Self-pay | Admitting: Physician Assistant

## 2023-11-12 DIAGNOSIS — E785 Hyperlipidemia, unspecified: Secondary | ICD-10-CM

## 2023-11-20 ENCOUNTER — Other Ambulatory Visit (HOSPITAL_COMMUNITY)
Admission: RE | Admit: 2023-11-20 | Discharge: 2023-11-20 | Disposition: A | Discharge status: Home / Self Care | Payer: Self-pay | Source: Ambulatory Visit | Attending: Physician Assistant | Admitting: Physician Assistant

## 2023-11-20 DIAGNOSIS — E785 Hyperlipidemia, unspecified: Secondary | ICD-10-CM | POA: Insufficient documentation

## 2023-11-20 LAB — HEPATIC FUNCTION PANEL
ALT: 18 U/L (ref 0–44)
AST: 23 U/L (ref 15–41)
Albumin: 3.6 g/dL (ref 3.5–5.0)
Alkaline Phosphatase: 106 U/L (ref 38–126)
Bilirubin, Direct: 0.1 mg/dL (ref 0.0–0.2)
Indirect Bilirubin: 0.4 mg/dL (ref 0.3–0.9)
Total Bilirubin: 0.5 mg/dL (ref 0.0–1.2)
Total Protein: 6.9 g/dL (ref 6.5–8.1)

## 2023-11-20 LAB — LIPID PANEL
Cholesterol: 182 mg/dL (ref 0–200)
HDL: 52 mg/dL (ref >40)
LDL Cholesterol: 108 mg/dL — ABNORMAL HIGH (ref 0–99)
Total CHOL/HDL Ratio: 3.5 ratio
Triglycerides: 109 mg/dL (ref <150)
VLDL: 22 mg/dL (ref 0–40)

## 2023-11-26 ENCOUNTER — Ambulatory Visit: Payer: Self-pay | Admitting: Physician Assistant

## 2023-11-26 ENCOUNTER — Encounter: Payer: Self-pay | Admitting: Physician Assistant

## 2023-11-26 VITALS — BP 121/70 | HR 102 | Temp 97.7°F | Wt 218.5 lb

## 2023-11-26 DIAGNOSIS — E785 Hyperlipidemia, unspecified: Secondary | ICD-10-CM

## 2023-11-26 DIAGNOSIS — Z1239 Encounter for other screening for malignant neoplasm of breast: Secondary | ICD-10-CM

## 2023-11-26 DIAGNOSIS — Z6835 Body mass index (BMI) 35.0-35.9, adult: Secondary | ICD-10-CM

## 2023-11-26 DIAGNOSIS — M25531 Pain in right wrist: Secondary | ICD-10-CM

## 2023-11-26 DIAGNOSIS — Z789 Other specified health status: Secondary | ICD-10-CM

## 2023-11-26 MED ORDER — SIMVASTATIN 20 MG PO TABS: ORAL_TABLET | ORAL | 6 refills | Status: DC

## 2023-11-26 NOTE — Progress Notes (Signed)
 BP 121/70   Pulse (!) 102   Temp 97.7 F (36.5 C)   Wt 218 lb 8 oz (99.1 kg)   LMP 06/16/2008   SpO2 97%   BMI 35.27 kg/m    Subjective:    Patient ID: Heidi Gibson, female    DOB: 08-Dec-1965, 58 y.o.   MRN: [id number]  HPI: Heidi Gibson is a 58 y.o. female presenting on 11/26/2023 for Hyperlipidemia and Dizziness (Pt states she felt dizzy yesterday with HA, and lots of bloating, burping and flatulence. Pt states she took an IBU for the HA which helped. Pt states she is feeling well today.  Pt states she is unsure why she felt this way, pt believes maybe due to being stressed of having little work and the stress of paying bills, or possibly her blood pressure.)   HPI  Chief Complaint  Patient presents with   Hyperlipidemia   Dizziness    Pt states she felt dizzy yesterday with HA, and lots of bloating, burping and flatulence. Pt states she took an IBU for the HA which helped. Pt states she is feeling well today.  Pt states she is unsure why she felt this way, pt believes maybe due to being stressed of having little work and the stress of paying bills, or possibly her blood pressure.     Pt says that she has problems with Both wrists hurting.  Sometimes her elbows hurt and occasionally her knees. She makes boxes at work.     Relevant past medical, surgical, family and social history reviewed and updated as indicated. Interim medical history since our last visit reviewed. Allergies and medications reviewed and updated.   Current Outpatient Medications:    ibuprofen  (ADVIL ) 600 MG tablet, Take 600 mg by mouth every 6 (six) hours as needed for mild pain (pain score 1-3)., Disp: , Rfl:    simethicone (MYLICON) 125 MG chewable tablet, Chew 125 mg by mouth every 6 (six) hours as needed for flatulence., Disp: , Rfl:    simvastatin  (ZOCOR ) 20 MG tablet, TOME UNA TABLETA POR BOCA AL DORMIR., Disp: 30 tablet, Rfl: 6   naproxen  sodium (ALEVE ) 220 MG  tablet, Take 1 tablet (220 mg total) by mouth 2 (two) times daily as needed. (Patient not taking: Reported on 11/26/2023), Disp: , Rfl:     Review of Systems  Per HPI unless specifically indicated above     Objective:    BP 121/70   Pulse (!) 102   Temp 97.7 F (36.5 C)   Wt 218 lb 8 oz (99.1 kg)   LMP 06/16/2008   SpO2 97%   BMI 35.27 kg/m   Wt Readings from Last 3 Encounters:  11/26/23 218 lb 8 oz (99.1 kg)  07/30/23 217 lb (98.4 kg)  03/26/23 221 lb 12 oz (100.6 kg)    Physical Exam Vitals reviewed.  Constitutional:      General: She is not in acute distress.    Appearance: She is well-developed. She is not toxic-appearing.  HENT:     Head: Normocephalic and atraumatic.   Cardiovascular:     Rate and Rhythm: Normal rate and regular rhythm.     Pulses:          Radial pulses are 2+ on the right side and 2+ on the left side.  Pulmonary:     Effort: Pulmonary effort is normal.     Breath sounds: Normal breath sounds.  Abdominal:  General: Bowel sounds are normal.     Palpations: Abdomen is soft. There is no mass.     Tenderness: There is no abdominal tenderness.   Musculoskeletal:     Right wrist: Tenderness present. No swelling, deformity, effusion, bony tenderness, snuff box tenderness or crepitus. Normal range of motion. Normal pulse.     Left wrist: Tenderness present. No swelling, deformity, effusion, bony tenderness, snuff box tenderness or crepitus. Normal range of motion. Normal pulse.     Cervical back: Neck supple.     Right lower leg: No edema.     Left lower leg: No edema.     Comments: Mild generalized tenderness B wrist. Negative tinel & phalen  Lymphadenopathy:     Cervical: No cervical adenopathy.   Skin:    General: Skin is warm and dry.   Neurological:     Mental Status: She is alert and oriented to person, place, and time.   Psychiatric:        Behavior: Behavior normal.     Results for orders placed or performed during the  hospital encounter of 11/20/23  Hepatic function panel   Collection Time: 11/20/23  8:30 AM  Result Value Ref Range   Total Protein 6.9 6.5 - 8.1 g/dL   Albumin 3.6 3.5 - 5.0 g/dL   AST 23 15 - 41 U/L   ALT 18 0 - 44 U/L   Alkaline Phosphatase 106 38 - 126 U/L   Total Bilirubin 0.5 0.0 - 1.2 mg/dL   Bilirubin, Direct 0.1 0.0 - 0.2 mg/dL   Indirect Bilirubin 0.4 0.3 - 0.9 mg/dL  Lipid panel   Collection Time: 11/20/23  8:30 AM  Result Value Ref Range   Cholesterol 182 0 - 200 mg/dL   Triglycerides 161 <096 mg/dL   HDL 52 >04 mg/dL   Total CHOL/HDL Ratio 3.5 RATIO   VLDL 22 0 - 40 mg/dL   LDL Cholesterol 540 (H) 0 - 99 mg/dL      Assessment & Plan:     Encounter Diagnoses  Name Primary?   Hyperlipidemia, unspecified hyperlipidemia type Yes   Encounter for screening for malignant neoplasm of breast, unspecified screening modality    Pain in both wrists    BMI 35.0-35.9,adult    Not proficient in English language       Dyslipidemia -reviewed labs with pt  -pt to continue current medication and lowfat diet  HCM -pt was referred for routine screening Mammogram -pt was scheduled to update her covid vaccination today but she wants to wait since she has appointment with dentist later today  Wrist pain -discussed tendonitis vs arthritis -recommended that she ice the wrists for 15 minutes after returning home from work -she can use NSAID prn -if progresses, can discuss referral to orthopedics  Pt to follow up four months. She is to contact office sooner prn

## 2023-12-01 ENCOUNTER — Telehealth: Payer: Self-pay

## 2023-12-01 NOTE — Telephone Encounter (Signed)
 Telephoned patient at mobile number using interpreter, Mechele Spiegel. Left a voice message with [facility] contact information.

## 2024-01-06 ENCOUNTER — Other Ambulatory Visit (HOSPITAL_COMMUNITY): Payer: Self-pay | Admitting: Obstetrics and Gynecology

## 2024-01-06 DIAGNOSIS — Z1231 Encounter for screening mammogram for malignant neoplasm of breast: Secondary | ICD-10-CM

## 2024-01-22 ENCOUNTER — Encounter (HOSPITAL_COMMUNITY): Payer: Self-pay

## 2024-01-22 ENCOUNTER — Ambulatory Visit (HOSPITAL_COMMUNITY)
Admission: RE | Admit: 2024-01-22 | Discharge: 2024-01-22 | Disposition: A | Discharge status: Home / Self Care | Payer: Self-pay | Source: Ambulatory Visit | Attending: Obstetrics and Gynecology | Admitting: Obstetrics and Gynecology

## 2024-01-22 ENCOUNTER — Inpatient Hospital Stay: Payer: Self-pay | Attending: Obstetrics and Gynecology | Admitting: *Deleted

## 2024-01-22 VITALS — BP 132/86 | Ht 66.0 in | Wt 218.7 lb

## 2024-01-22 DIAGNOSIS — Z1239 Encounter for other screening for malignant neoplasm of breast: Secondary | ICD-10-CM

## 2024-01-22 DIAGNOSIS — Z1231 Encounter for screening mammogram for malignant neoplasm of breast: Secondary | ICD-10-CM | POA: Insufficient documentation

## 2024-01-22 NOTE — Progress Notes (Signed)
 [name] is a 58 y.o. female who presents to [facility] clinic today with no complaints.    Pap Smear: Pap smear not completed today. Last Pap smear was 11/06/2020 at [facility] and was normal with negative HPV. Per patient has no history of an abnormal Pap smear. Last Pap smear result is available in Epic.   Physical exam: Breasts Left breast is larger than right breast that per patient is normal for her. No skin abnormalities bilateral breasts. No nipple retraction bilateral breasts. No nipple discharge bilateral breasts. No lymphadenopathy. No lumps palpated bilateral breasts. No complaints of pain or tenderness on exam.      MS 3D SCR MAMMO BILAT BR (aka MM) Result Date: 01/07/2023 CLINICAL DATA:  Screening. EXAM: DIGITAL SCREENING BILATERAL MAMMOGRAM WITH TOMOSYNTHESIS AND CAD TECHNIQUE: Bilateral screening digital craniocaudal and mediolateral oblique mammograms were obtained. Bilateral screening digital breast tomosynthesis was performed. The images were evaluated with computer-aided detection. Best images possible per technologist communication. COMPARISON:  Previous exam(s). ACR Breast Density Category b: There are scattered areas of fibroglandular density. FINDINGS: There are no findings suspicious for malignancy. IMPRESSION: No mammographic evidence of malignancy. A result letter of this screening mammogram will be mailed directly to the patient. RECOMMENDATION: Screening mammogram in one year. (Code:SM-B-01Y) BI-RADS CATEGORY  1: Negative. Electronically Signed   By: Corean Salter M.D.   On: 01/07/2023 10:48   MS DIGITAL SCREENING TOMO BILATERAL Result Date: 10/19/2021 CLINICAL DATA:  Screening. EXAM: DIGITAL SCREENING BILATERAL MAMMOGRAM WITH TOMOSYNTHESIS AND CAD TECHNIQUE: Bilateral screening digital craniocaudal and mediolateral oblique mammograms were obtained. Bilateral screening digital breast tomosynthesis was performed. The images  were evaluated with computer-aided detection. COMPARISON:  Previous exam(s). ACR Breast Density Category b: There are scattered areas of fibroglandular density. FINDINGS: There are no findings suspicious for malignancy. IMPRESSION: No mammographic evidence of malignancy. A result letter of this screening mammogram will be mailed directly to the patient. RECOMMENDATION: Screening mammogram in one year. (Code:SM-B-01Y) BI-RADS CATEGORY  1: Negative. Electronically Signed   By: Almarie Daring M.D.   On: 10/19/2021 08:28   MS DIGITAL SCREENING TOMO BILATERAL Result Date: 07/27/2020 CLINICAL DATA:  Screening. EXAM: DIGITAL SCREENING BILATERAL MAMMOGRAM WITH TOMOSYNTHESIS AND CAD TECHNIQUE: Bilateral screening digital craniocaudal and mediolateral oblique mammograms were obtained. Bilateral screening digital breast tomosynthesis was performed. The images were evaluated with computer-aided detection. COMPARISON:  Previous exam(s). ACR Breast Density Category b: There are scattered areas of fibroglandular density. FINDINGS: There are no findings suspicious for malignancy. IMPRESSION: No mammographic evidence of malignancy. A result letter of this screening mammogram will be mailed directly to the patient. RECOMMENDATION: Screening mammogram in one year. (Code:SM-B-01Y) BI-RADS CATEGORY  1: Negative. Electronically Signed   By: Serena  Chacko M.D.   On: 07/27/2020 16:09   MS DIGITAL SCREENING TOMO BILATERAL Result Date: 07/05/2019 CLINICAL DATA:  Screening. EXAM: DIGITAL SCREENING BILATERAL MAMMOGRAM WITH TOMO AND CAD COMPARISON:  Previous exam(s). ACR Breast Density Category b: There are scattered areas of fibroglandular density. FINDINGS: There are no findings suspicious for malignancy. Images were processed with CAD. IMPRESSION: No mammographic evidence of malignancy. A result letter of this screening mammogram will be mailed directly to the patient. RECOMMENDATION: Screening mammogram in one year. (Code:SM-B-01Y)  BI-RADS CATEGORY  1: Negative. Electronically Signed   By: Toribio Agreste M.D.   On: 07/05/2019 14:44   Pelvic/Bimanual Pap is not indicated today per [facility] guidelines.   Smoking History: Patient has never smoked  Patient Navigation: Patient education provided. Access to services provided for patient through [facility] program. Spanish interpreter [name] from [facility] provided.   Colorectal Cancer Screening: Per patient has never had colonoscopy completed. Patient completed a FIT test given by her PCP 09/14/2023 that was negative. No complaints today.    Breast and Cervical Cancer Risk Assessment: Patient does not have family history of breast cancer, known genetic mutations, or radiation treatment to the chest before age 47. Patient does not have history of cervical dysplasia, immunocompromised, or DES exposure in-utero.  Risk Assessment   No risk assessment data for the current encounter  Risk Scores       10/18/2021   Last edited by: Rogerio Tempie SQUIBB, LPN   5-year risk: 0.6%   Lifetime risk: 4.3%            A: [facility] exam without pap smear No complaints.  P: Referred patient to [facility] for a screening mammogram. Appointment scheduled Friday, January 22, 2024 at 1130.  Driscilla Wanda SQUIBB, RN 01/22/2024 10:56 AM

## 2024-01-22 NOTE — Patient Instructions (Addendum)
 Explained breast self awareness with Heidi Gibson. Patient did not need a Pap smear today due to last Pap smear and HPV typing was 11/06/2020. Let her know [facility] will cover Pap smears and HPV typing every 5 years unless has a history of abnormal Pap smears. Referred patient to [facility] for a screening mammogram. Appointment scheduled Friday, January 22, 2024 at 1130. Patient aware of appointment and will be there. Let patient know [facility] will follow up with her within the next couple weeks with results of her of her mammogram by letter or phone. Heidi Gibson verbalized understanding.  Tomasz Steeves, Wanda Ship, RN 10:57 AM

## 2024-02-08 ENCOUNTER — Encounter: Payer: Self-pay | Admitting: Physician Assistant

## 2024-02-08 ENCOUNTER — Ambulatory Visit (HOSPITAL_COMMUNITY)
Admission: RE | Admit: 2024-02-08 | Discharge: 2024-02-08 | Disposition: A | Discharge status: Home / Self Care | Payer: Self-pay | Source: Ambulatory Visit | Attending: Physician Assistant | Admitting: Physician Assistant

## 2024-02-08 ENCOUNTER — Ambulatory Visit: Payer: Self-pay | Admitting: Physician Assistant

## 2024-02-08 VITALS — BP 154/94 | HR 105 | Temp 97.8°F

## 2024-02-08 DIAGNOSIS — M5442 Lumbago with sciatica, left side: Secondary | ICD-10-CM | POA: Insufficient documentation

## 2024-02-08 DIAGNOSIS — Z789 Other specified health status: Secondary | ICD-10-CM

## 2024-02-08 DIAGNOSIS — G8929 Other chronic pain: Secondary | ICD-10-CM

## 2024-02-08 MED ORDER — HYDROCODONE-ACETAMINOPHEN 10-325 MG PO TABS: 1.0000 | ORAL_TABLET | Freq: Three times a day (TID) | ORAL | 0 refills | Status: DC | PRN

## 2024-02-08 MED ORDER — PREDNISONE 20 MG PO TABS
40.0000 mg | ORAL_TABLET | Freq: Every day | ORAL | 0 refills | Status: DC
Start: 2024-02-08 — End: 2024-02-08

## 2024-02-08 MED ORDER — HYDROCODONE-ACETAMINOPHEN 10-325 MG PO TABS: 1.0000 | ORAL_TABLET | Freq: Three times a day (TID) | ORAL | 0 refills | Status: AC | PRN

## 2024-02-08 MED ORDER — PREDNISONE 20 MG PO TABS: 40.0000 mg | ORAL_TABLET | Freq: Every day | ORAL | 0 refills | Status: DC

## 2024-02-08 NOTE — Progress Notes (Signed)
 BP (!) 154/94   Pulse (!) 105   Temp 97.8 F (36.6 C)   LMP 06/16/2008    Subjective:    Patient ID: Heidi Gibson, female    DOB: 1965-09-25, 58 y.o.   MRN: [id number]  HPI: Heidi Gibson is a 58 y.o. female presenting on 02/08/2024 for No chief complaint on file.   HPI  Since Saturday- pain, back waist LLE so much pain.  Hurts a lot to walk.   She worked Saturday and pain started Saturday before she went to work.  Pain increased as the day went on.   Apap helped a little bit.  Icy hot she tried also.    Friday she felt fine (except for the dental pain)  She has no known injury    She had dentist appointment on Thursday and she has been having dental pain that started after the appointment also.   She got burnt- with laser- she needs 10 doses.    Her muscles are inflamed in the TMJ and that is why they are burning it with the laser.  Dr  geoffrey. They did the first treatment on Thursday.    Relevant past medical, surgical, family and social history reviewed and updated as indicated. Interim medical history since our last visit reviewed. Allergies and medications reviewed and updated.  Review of Systems  Per HPI unless specifically indicated above     Objective:    BP (!) 154/94   Pulse (!) 105   Temp 97.8 F (36.6 C)   LMP 06/16/2008   Wt Readings from Last 3 Encounters:  01/22/24 218 lb 11.2 oz (99.2 kg)  11/26/23 218 lb 8 oz (99.1 kg)  07/30/23 217 lb (98.4 kg)    Physical Exam Constitutional:      General: She is not in acute distress.    Appearance: She is obese. She is not toxic-appearing.  HENT:     Head: Normocephalic and atraumatic.  Cardiovascular:     Rate and Rhythm: Normal rate and regular rhythm.  Pulmonary:     Effort: Pulmonary effort is normal. No respiratory distress.     Breath sounds: Normal breath sounds. No stridor. No wheezing or rhonchi.  Abdominal:     General: Bowel sounds are normal.      Palpations: Abdomen is soft. There is no mass or pulsatile mass.     Tenderness: There is no abdominal tenderness.  Musculoskeletal:     Right shoulder: Normal. Normal range of motion.     Left shoulder: Normal. Normal range of motion.     Cervical back: Normal.     Thoracic back: Normal.     Lumbar back: Tenderness present. No swelling, edema or signs of trauma. Negative right straight leg raise test and negative left straight leg raise test.     Right hip: Normal range of motion.     Left hip: Normal range of motion.     Right knee: Normal.     Left knee: Normal.     Right lower leg: No edema.     Left lower leg: No edema.     Right foot: Normal pulse.     Left foot: Normal pulse.  Skin:    General: Skin is warm and dry.  Neurological:     Mental Status: She is alert.     Sensory: Sensation is intact.  Psychiatric:        Mood and Affect: Affect is tearful.  Behavior: Behavior normal.           Assessment & Plan:   Encounter Diagnoses  Name Primary?   Acute midline low back pain with left-sided sciatica Yes   Chronic TMJ pain    Not proficient in English language        -TMJ treatment per dentist -xray back.  Unlikely findings but need to check for compression fx -rx prednisone  and vicodin.  Pt was counseled to avoid driving on vicodin due to sedation -heat 10-20 minutes several times/day -note to be out of work today and tomorrow -pt to contact office if persists or new symptoms

## 2024-02-23 ENCOUNTER — Ambulatory Visit: Payer: Self-pay | Admitting: Physician Assistant

## 2024-03-14 ENCOUNTER — Other Ambulatory Visit: Payer: Self-pay | Admitting: Physician Assistant

## 2024-03-14 DIAGNOSIS — E785 Hyperlipidemia, unspecified: Secondary | ICD-10-CM

## 2024-03-29 ENCOUNTER — Other Ambulatory Visit (HOSPITAL_COMMUNITY)
Admission: RE | Admit: 2024-03-29 | Discharge: 2024-03-29 | Disposition: A | Discharge status: Home / Self Care | Payer: Self-pay | Source: Ambulatory Visit | Attending: Physician Assistant | Admitting: Physician Assistant

## 2024-03-29 ENCOUNTER — Encounter: Payer: Self-pay | Admitting: Physician Assistant

## 2024-03-29 ENCOUNTER — Ambulatory Visit: Payer: Self-pay | Admitting: Physician Assistant

## 2024-03-29 VITALS — BP 126/80 | HR 91 | Temp 97.9°F | Ht 63.5 in | Wt 215.5 lb

## 2024-03-29 DIAGNOSIS — E785 Hyperlipidemia, unspecified: Secondary | ICD-10-CM

## 2024-03-29 DIAGNOSIS — Z6837 Body mass index (BMI) 37.0-37.9, adult: Secondary | ICD-10-CM

## 2024-03-29 DIAGNOSIS — Z789 Other specified health status: Secondary | ICD-10-CM

## 2024-03-29 DIAGNOSIS — M545 Low back pain, unspecified: Secondary | ICD-10-CM

## 2024-03-29 LAB — LIPID PANEL
Cholesterol: 171 mg/dL (ref 0–200)
HDL: 57 mg/dL (ref >40)
LDL Cholesterol: 94 mg/dL (ref 0–99)
Total CHOL/HDL Ratio: 3 ratio
Triglycerides: 99 mg/dL (ref <150)
VLDL: 20 mg/dL (ref 0–40)

## 2024-03-29 LAB — COMPREHENSIVE METABOLIC PANEL WITH GFR
ALT: 15 U/L (ref 0–44)
AST: 21 U/L (ref 15–41)
Albumin: 4.4 g/dL (ref 3.5–5.0)
Alkaline Phosphatase: 117 U/L (ref 38–126)
Anion gap: 7 (ref 5–15)
BUN: 12 mg/dL (ref 6–20)
CO2: 29 mmol/L (ref 22–32)
Calcium: 9.3 mg/dL (ref 8.9–10.3)
Chloride: 103 mmol/L (ref 98–111)
Creatinine, Ser: 0.65 mg/dL (ref 0.44–1.00)
GFR, Estimated: >60 mL/min (ref >60)
Glucose, Bld: 99 mg/dL (ref 70–99)
Potassium: 3.9 mmol/L (ref 3.5–5.1)
Sodium: 140 mmol/L (ref 135–145)
Total Bilirubin: 0.4 mg/dL (ref 0.0–1.2)
Total Protein: 7.3 g/dL (ref 6.5–8.1)

## 2024-03-29 MED ORDER — SIMVASTATIN 20 MG PO TABS
ORAL_TABLET | ORAL | 6 refills | Status: AC
Start: 2024-03-29 — End: ?

## 2024-03-29 NOTE — Patient Instructions (Signed)
Ejercicios para la espalda Back Exercises Los siguientes ejercicios fortalecen los msculos que dan soporte al tronco (torso) y a la espalda. Adems, ayudan a mantener la flexibilidad de la zona lumbar. Hacer estos ejercicios puede ser de ayuda para evitar o aliviar el dolor lumbar. Si tiene dolor o molestias en la espalda, intente hacer estos ejercicios 2 o 3 veces por da, o como se lo haya indicado el mdico. A medida que el dolor desaparece, hgalos una vez por da, pero aumente la cantidad de veces que repite los pasos para cada ejercicio (haga ms repeticiones). Para prevenir la recurrencia del dolor de espalda, contine haciendo estos ejercicios una vez al da o como se lo haya indicado el mdico. Haga los ejercicios exactamente como se lo haya indicado el mdico y gradelos como se lo hayan indicado. Es normal sentir un leve estiramiento, tirn, rigidez o molestia cuando haga estos ejercicios, pero debe detenerse de inmediato si siente un dolor repentino o si el dolor empeora. Ejercicios Rodilla al pecho Repita estos pasos 3 a 5 veces con cada pierna: Acustese boca arriba sobre una cama dura o sobre el suelo con las piernas extendidas. Lleve una rodilla al pecho. La otra pierna debe quedar extendida y en contacto con el suelo. Mantenga la rodilla contra el pecho al tomarse la rodilla o el muslo con ambas manos y sostenga. Tire de la rodilla hasta sentir una elongacin suave en la parte baja de la espalda o las nalgas. Mantenga la elongacin durante 10 a 30 segundos. Suelte y extienda la pierna lentamente.  Inclinacin de la pelvis Repita estos pasos 5 a 10 veces: Acustese boca arriba sobre una cama dura o sobre el suelo con las piernas extendidas. Flexione las rodillas de modo que apunten al techo y los pies queden apoyados en el suelo. Contraiga los msculos de la parte baja del abdomen para empujar la zona lumbar contra el suelo. Con este movimiento se inclinar la pelvis de modo que  el coxis apunte hacia el techo, en lugar de apuntar a los pies o al suelo. Contraiga suavemente y respire con normalidad mientras mantiene esta posicin durante 5 a 10 segundos.  El perro y el gato Repita estos pasos hasta que la zona lumbar se vuelva ms flexible: Apoye las palmas de las manos y las rodillas sobre una cama firme o el suelo. Las manos deben estar alineadas con los hombros y las rodillas con las caderas. Puede colocarse almohadillas debajo de las rodillas para estar cmodo. Deje que la cabeza cuelgue hacia el pecho. Contraiga los msculos abdominales y baje el coxis en direccin al suelo de modo que la zona lumbar se arquee como el lomo de un gato asustado. Mantenga esta posicin durante 5 segundos. Lentamente, levante la cabeza, relaje los msculos abdominales y eleve el coxis de modo que apunte en direccin al techo para que la espalda forme un arco hundido como el lomo de un perro contento. Mantenga esta posicin durante 5 segundos.  Flexiones de brazos Repita estos pasos 5 a 10 veces: Acustese sobre el abdomen (boca abajo) en una cama firme o en el suelo. Coloque las palmas de las manos cerca de la cabeza, separadas aproximadamente al ancho de los hombros. Con la espalda lo ms relajada posible y las caderas apoyadas en el suelo, extienda lentamente los brazos para levantar la mitad superior del cuerpo y elevar los hombros. No use los msculos de la espalda para elevar la parte superior del torso. Puede cambiar las manos de   lugar para estar ms cmodo. Mantenga esta posicin durante 5 segundos mientras mantiene la espalda relajada. Lentamente vuelva a la posicin horizontal.  Puentes Repita estos pasos 10 veces: Acustese boca arriba sobre una cama firme o sobre el suelo. Flexione las rodillas de modo que apunten al techo y los pies queden apoyados en el suelo. Los brazos deben estar paralelos a los costados del cuerpo, junto al cuerpo. Contraiga los glteos y despegue las  nalgas del suelo hasta que la cintura est casi a la misma altura que las rodillas. Debe sentir el trabajo muscular en las nalgas y la parte de atrs de los muslos. Si no siente el esfuerzo de [employer], aleje los pies 1 a 2 pulgadas (2.5 a 5 cm) de las nalgas. Mantenga esta posicin durante 3 a 5 segundos. Baje lentamente las caderas a la posicin inicial y relaje las nalgas por completo. Si este ejercicio le resulta muy fcil, intente realizarlo con los brazos cruzados [place]. Abdominales Repita estos pasos 5 a 10 veces: Acustese boca arriba sobre una cama dura o sobre el suelo con las piernas extendidas. Flexione las rodillas de modo que apunten al techo y los pies queden apoyados en el suelo. Cruce los [employer]. Baje levemente el mentn en direccin al pecho sin doblar el cuello. Contraiga los msculos abdominales y con lentitud eleve el torso lo suficiente como para Artist los omplatos del suelo. No eleve el torso ms que eso, porque esto puede sobreexigir a la zona lumbar y no ayuda a Physiological scientist abdominales. Regrese lentamente a la posicin inicial.  Elevaciones de espalda Repita estos pasos 5 a 10 veces: Acustese sobre el abdomen (boca abajo) con los brazos a los costados del cuerpo y apoye la frente en el suelo. Contraiga los msculos de las piernas y las nalgas. Lentamente despegue el pecho del suelo mientras mantiene las caderas bien apoyadas en el suelo. Mantenga la nuca alineada con la curvatura de la espalda. Los ojos deben mirar al suelo. Mantenga esta posicin durante 3 a 5 segundos. Regrese lentamente a la posicin inicial.  Comunquese con un mdico si: El dolor o las molestias en la espalda se vuelven mucho ms intensos cuando hace un ejercicio. El dolor o las molestias en la espalda que [place], no se Copy en el trmino de las 2 horas posteriores a Copy. Si tiene alguno de [employer], deje de  [employer] ejercicios de inmediato. No vuelva a hacer los ejercicios a menos que el mdico lo autorice. Solicite ayuda de inmediato si: Siente un dolor sbito e intenso en la espalda. Si esto ocurre, deje de [employer] ejercicios de inmediato. No vuelva a hacer los ejercicios a menos que el mdico lo autorice. Esta informacin no tiene Theme park manager el consejo del mdico. Asegrese de hacerle al mdico cualquier pregunta que tenga. Document Revised: 12/21/2020 Document Reviewed: 12/21/2020 Elsevier Patient Education  2024 [employer].

## 2024-03-29 NOTE — Progress Notes (Signed)
 BP 126/80   Pulse 91   Temp 97.9 F (36.6 C)   Ht 5' 3.5 (1.613 m)   Wt 215 lb 8 oz (97.8 kg)   LMP 06/16/2008   SpO2 98%   BMI 37.58 kg/m    Subjective:    Patient ID: Heidi Gibson, female    DOB: 1965/12/18, 58 y.o.   MRN: [id number]  HPI: Heidi Gibson is a 58 y.o. female presenting on 03/29/2024 for Hyperlipidemia   HPI  Pt is pleasant 58yoF in today for routine f/u dyslipidemia.  She says she is doing well.  Her only complaint is occasional back pain and naproxen  irritates her stomach.   Relevant past medical, surgical, family and social history reviewed and updated as indicated. Interim medical history since our last visit reviewed. Allergies and medications reviewed and updated.    Current Outpatient Medications:    ibuprofen  (ADVIL ) 600 MG tablet, Take 600 mg by mouth every 6 (six) hours as needed for mild pain (pain score 1-3)., Disp: , Rfl:    naproxen  sodium (ALEVE ) 220 MG tablet, Take 1 tablet (220 mg total) by mouth 2 (two) times daily as needed., Disp: , Rfl:    simvastatin  (ZOCOR ) 20 MG tablet, TOME UNA TABLETA POR BOCA AL DORMIR., Disp: 30 tablet, Rfl: 6   Review of Systems  Per HPI unless specifically indicated above     Objective:    BP 126/80   Pulse 91   Temp 97.9 F (36.6 C)   Ht 5' 3.5 (1.613 m)   Wt 215 lb 8 oz (97.8 kg)   LMP 06/16/2008   SpO2 98%   BMI 37.58 kg/m   Wt Readings from Last 3 Encounters:  03/29/24 215 lb 8 oz (97.8 kg)  01/22/24 218 lb 11.2 oz (99.2 kg)  11/26/23 218 lb 8 oz (99.1 kg)    Physical Exam Vitals reviewed.  Constitutional:      General: She is not in acute distress.    Appearance: She is well-developed. She is not toxic-appearing.  HENT:     Head: Normocephalic and atraumatic.  Cardiovascular:     Rate and Rhythm: Normal rate and regular rhythm.  Pulmonary:     Effort: Pulmonary effort is normal.     Breath sounds: Normal breath sounds.  Abdominal:      General: Bowel sounds are normal.     Palpations: Abdomen is soft. There is no mass.     Tenderness: There is no abdominal tenderness.  Musculoskeletal:     Cervical back: Neck supple.     Thoracic back: Normal.     Lumbar back: Normal.     Right lower leg: No edema.     Left lower leg: No edema.  Lymphadenopathy:     Cervical: No cervical adenopathy.  Skin:    General: Skin is warm and dry.  Neurological:     Mental Status: She is alert and oriented to person, place, and time.  Psychiatric:        Behavior: Behavior normal.     Results for orders placed or performed during the hospital encounter of 03/29/24  Lipid panel   Collection Time: 03/29/24  9:12 AM  Result Value Ref Range   Cholesterol 171 0 - 200 mg/dL   Triglycerides 99 <849 mg/dL   HDL 57 >59 mg/dL   Total CHOL/HDL Ratio 3.0 RATIO   VLDL 20 0 - 40 mg/dL   LDL Cholesterol 94 0 -  99 mg/dL      Assessment & Plan:   Encounter Diagnoses  Name Primary?   Hyperlipidemia, unspecified hyperlipidemia type Yes   Low back pain without sciatica, unspecified back pain laterality, unspecified chronicity    BMI 37.0-37.9, adult    Not proficient in [place] language      -reviewed lipid panel labs with pt.  Cmp pending -pt Already got flu shot -pt to continue simvastatin .  Refills sent to pharmacy -counseled pt on back care.  Recommended APAP.  She was counseled on weight as it relates to back pain.  She was given back exercises -pt to follow up six months.  She is to contact office sooner prn

## 2024-03-30 ENCOUNTER — Ambulatory Visit: Payer: Self-pay | Admitting: Physician Assistant

## 2024-06-19 ENCOUNTER — Other Ambulatory Visit: Payer: Self-pay | Admitting: Physician Assistant

## 2024-09-27 ENCOUNTER — Ambulatory Visit: Payer: Self-pay | Admitting: Physician Assistant
